# Patient Record
Sex: Female | Born: 1970 | Race: White | Hispanic: No | Marital: Married | Smoking: Never smoker
Health system: Southern US, Community
[De-identification: ages and names within clinical notes are randomized; demographics above are authoritative.]

## PROBLEM LIST (undated history)

## (undated) HISTORY — PX: BACK SURGERY: SHX140

## (undated) HISTORY — PX: NECK SURGERY: SHX720

---

## 1999-01-31 HISTORY — PX: TUBAL LIGATION: SHX77

## 2004-11-08 ENCOUNTER — Emergency Department: Payer: Self-pay | Admitting: Emergency Medicine

## 2004-11-15 ENCOUNTER — Emergency Department: Payer: Self-pay | Admitting: Emergency Medicine

## 2005-01-03 ENCOUNTER — Ambulatory Visit: Payer: Self-pay | Admitting: Obstetrics and Gynecology

## 2005-01-13 ENCOUNTER — Ambulatory Visit: Payer: Self-pay | Admitting: Obstetrics and Gynecology

## 2005-09-13 ENCOUNTER — Emergency Department: Payer: Self-pay | Admitting: Emergency Medicine

## 2008-05-29 ENCOUNTER — Emergency Department: Payer: Self-pay | Admitting: Emergency Medicine

## 2009-07-12 ENCOUNTER — Ambulatory Visit: Payer: Self-pay | Admitting: Nurse Practitioner

## 2009-08-23 ENCOUNTER — Ambulatory Visit (HOSPITAL_COMMUNITY): Admission: RE | Admit: 2009-08-23 | Discharge: 2009-08-25 | Payer: Self-pay | Admitting: Neurosurgery

## 2010-04-16 LAB — CBC
HCT: 28.1 % — ABNORMAL LOW (ref 36.0–46.0)
Hemoglobin: 9.3 g/dL — ABNORMAL LOW (ref 12.0–15.0)
MCH: 25.7 pg — ABNORMAL LOW (ref 26.0–34.0)
MCHC: 33.1 g/dL (ref 30.0–36.0)
MCV: 77.8 fL — ABNORMAL LOW (ref 78.0–100.0)
Platelets: 286 10*3/uL (ref 150–400)
RBC: 3.62 MIL/uL — ABNORMAL LOW (ref 3.87–5.11)
RDW: 20.1 % — ABNORMAL HIGH (ref 11.5–15.5)
WBC: 5.6 10*3/uL (ref 4.0–10.5)

## 2010-04-16 LAB — BASIC METABOLIC PANEL
BUN: 9 mg/dL (ref 6–23)
CO2: 21 mEq/L (ref 19–32)
Calcium: 8.6 mg/dL (ref 8.4–10.5)
Chloride: 106 mEq/L (ref 96–112)
Creatinine, Ser: 0.66 mg/dL (ref 0.4–1.2)
GFR calc Af Amer: >60 mL/min (ref >60)
GFR calc non Af Amer: >60 mL/min (ref >60)
Glucose, Bld: 93 mg/dL (ref 70–99)
Potassium: 4.2 mEq/L (ref 3.5–5.1)
Sodium: 134 mEq/L — ABNORMAL LOW (ref 135–145)

## 2010-04-16 LAB — SURGICAL PCR SCREEN
MRSA, PCR: NEGATIVE
Staphylococcus aureus: NEGATIVE

## 2010-04-16 LAB — TYPE AND SCREEN
ABO/RH(D): O POS
Antibody Screen: NEGATIVE

## 2010-04-16 LAB — ABO/RH: ABO/RH(D): O POS

## 2015-01-30 ENCOUNTER — Emergency Department: Payer: Medicaid Other

## 2015-01-30 ENCOUNTER — Emergency Department
Admission: EM | Admit: 2015-01-30 | Discharge: 2015-01-30 | Disposition: A | Discharge status: Home / Self Care | Payer: Medicaid Other | Attending: Emergency Medicine | Admitting: Emergency Medicine

## 2015-01-30 ENCOUNTER — Encounter: Payer: Self-pay | Admitting: Emergency Medicine

## 2015-01-30 DIAGNOSIS — J069 Acute upper respiratory infection, unspecified: Secondary | ICD-10-CM | POA: Diagnosis not present

## 2015-01-30 DIAGNOSIS — R05 Cough: Secondary | ICD-10-CM | POA: Diagnosis present

## 2015-01-30 MED ORDER — GUAIFENESIN-CODEINE 100-10 MG/5ML PO SOLN: 10.0000 mL | Freq: Three times a day (TID) | ORAL | Status: DC | PRN

## 2015-01-30 NOTE — ED Notes (Signed)
Discussed discharge instructions, prescriptions, and follow-up care with patient. No questions or concerns at this time. Pt stable at discharge.  

## 2015-01-30 NOTE — ED Notes (Signed)
Cough fever and chills and body aches x 2 days

## 2015-01-30 NOTE — Discharge Instructions (Signed)
Upper Respiratory Infection, Adult Most upper respiratory infections (URIs) are a viral infection of the air passages leading to the lungs. A URI affects the nose, throat, and upper air passages. The most common type of URI is nasopharyngitis and is typically referred to as "the common cold." URIs run their course and usually go away on their own. Most of the time, a URI does not require medical attention, but sometimes a bacterial infection in the upper airways can follow a viral infection. This is called a secondary infection. Sinus and middle ear infections are common types of secondary upper respiratory infections. Bacterial pneumonia can also complicate a URI. A URI can worsen asthma and chronic obstructive pulmonary disease (COPD). Sometimes, these complications can require emergency medical care and may be life threatening.  CAUSES Almost all URIs are caused by viruses. A virus is a type of germ and can spread from one person to another.  RISKS FACTORS You may be at risk for a URI if:   You smoke.   You have chronic heart or lung disease.  You have a weakened defense (immune) system.   You are very young or very old.   You have nasal allergies or asthma.  You work in crowded or poorly ventilated areas.  You work in health care facilities or schools. SIGNS AND SYMPTOMS  Symptoms typically develop 2-3 days after you come in contact with a cold virus. Most viral URIs last 7-10 days. However, viral URIs from the influenza virus (flu virus) can last 14-18 days and are typically more severe. Symptoms may include:   Runny or stuffy (congested) nose.   Sneezing.   Cough.   Sore throat.   Headache.   Fatigue.   Fever.   Loss of appetite.   Pain in your forehead, behind your eyes, and over your cheekbones (sinus pain).  Muscle aches.  DIAGNOSIS  Your health care provider may diagnose a URI by:  Physical exam.  Tests to check that your symptoms are not due to  another condition such as:  Strep throat.  Sinusitis.  Pneumonia.  Asthma. TREATMENT  A URI goes away on its own with time. It cannot be cured with medicines, but medicines may be prescribed or recommended to relieve symptoms. Medicines may help:  Reduce your fever.  Reduce your cough.  Relieve nasal congestion. HOME CARE INSTRUCTIONS   Take medicines only as directed by your health care provider.   Gargle warm saltwater or take cough drops to comfort your throat as directed by your health care provider.  Use a warm mist humidifier or inhale steam from a shower to increase air moisture. This may make it easier to breathe.  Drink enough fluid to keep your urine clear or pale yellow.   Eat soups and other clear broths and maintain good nutrition.   Rest as needed.   Return to work when your temperature has returned to normal or as your health care provider advises. You may need to stay home longer to avoid infecting others. You can also use a face mask and careful hand washing to prevent spread of the virus.  Increase the usage of your inhaler if you have asthma.   Do not use any tobacco products, including cigarettes, chewing tobacco, or electronic cigarettes. If you need help quitting, ask your health care provider. PREVENTION  The best way to protect yourself from getting a cold is to practice good hygiene.   Avoid oral or hand contact with people with cold   symptoms.   Wash your hands often if contact occurs.  There is no clear evidence that vitamin C, vitamin E, echinacea, or exercise reduces the chance of developing a cold. However, it is always recommended to get plenty of rest, exercise, and practice good nutrition.  SEEK MEDICAL CARE IF:   You are getting worse rather than better.   Your symptoms are not controlled by medicine.   You have chills.  You have worsening shortness of breath.  You have brown or red mucus.  You have yellow or brown nasal  discharge.  You have pain in your face, especially when you bend forward.  You have a fever.  You have swollen neck glands.  You have pain while swallowing.  You have white areas in the back of your throat. SEEK IMMEDIATE MEDICAL CARE IF:   You have severe or persistent:  Headache.  Ear pain.  Sinus pain.  Chest pain.  You have chronic lung disease and any of the following:  Wheezing.  Prolonged cough.  Coughing up blood.  A change in your usual mucus.  You have a stiff neck.  You have changes in your:  Vision.  Hearing.  Thinking.  Mood. MAKE SURE YOU:   Understand these instructions.  Will watch your condition.  Will get help right away if you are not doing well or get worse.   This information is not intended to replace advice given to you by your health care provider. Make sure you discuss any questions you have with your health care provider.   Document Released: 07/12/2000 Document Revised: 06/02/2014 Document Reviewed: 04/23/2013 Elsevier Interactive Patient Education 2016 [employer]  

## 2015-01-30 NOTE — ED Provider Notes (Signed)
[facility] Emergency Department Provider Note  ____________________________________________  Time seen: Approximately 3:46 PM  I have reviewed the triage vital signs and the nursing notes.   HISTORY  Chief Complaint Cough   HPI Jacqueline Patterson is a 44 y.o. female who presents to the emergency department for evaluation of nonproductive cough, fever, and chills 2 days. She is also complains of body aches and chills. No relief with motrin.  History reviewed. No pertinent past medical history.  There are no active problems to display for this patient.   History reviewed. No pertinent past surgical history.  Current Outpatient Rx  Name  Route  Sig  Dispense  Refill  . guaiFENesin-codeine 100-10 MG/5ML syrup   Oral   Take 10 mLs by mouth 3 (three) times daily as needed.   120 mL   0     Allergies Review of patient's allergies indicates no known allergies.  No family history on file.  Social History Social History  Substance Use Topics  . Smoking status: Never Smoker   . Smokeless tobacco: None  . Alcohol Use: No    Review of Systems Constitutional: Positive for fever/chills Eyes: No visual changes. ENT: Positive for sore throat. Cardiovascular: Denies chest pain. Chest is "burning" Respiratory: Negative for shortness of breath. Positive for cough. Gastrointestinal: Negative for abdominal pain. Negative for nausea,  no vomiting.  No diarrhea.  Genitourinary: Negative for dysuria. Musculoskeletal: Positive for body aches Skin: Negative for rash. Neurological: Negative for headaches, Negative for focal weakness or numbness.  10-point ROS otherwise negative.  ____________________________________________   PHYSICAL EXAM:  VITAL SIGNS: ED Triage Vitals  Enc Vitals Group     BP 01/30/15 1404 136/93 mmHg     Pulse Rate 01/30/15 1404 84     Resp 01/30/15 1404 18     Temp 01/30/15 1404 99.1 F (37.3 C)     Temp Source 01/30/15 1404  Oral     SpO2 01/30/15 1404 99 %     Weight 01/30/15 1404 200 lb (90.719 kg)     Height 01/30/15 1404 5\' 4"  (1.626 m)     Head Cir --      Peak Flow --      Pain Score 01/30/15 1408 6     Pain Loc --      Pain Edu? --      Excl. in GC? --     Constitutional: Alert and oriented. Well appearing and in no acute distress. Eyes: Conjunctivae are normal. PERRL. EOMI. Ears: Serous OM bilaterally Head: Atraumatic. Nose: No congestion/rhinnorhea. Mouth/Throat: Mucous membranes are moist.  Oropharynx erythematous with bilateral tonsillar edema, no exudate noted. Neck: No stridor.  Lymphatic: Bilateral anterior cervical lymphadenopathy. Cardiovascular: Normal rate, regular rhythm. Grossly normal heart sounds.  Good peripheral circulation. Respiratory: Normal respiratory effort.  No retractions. Lungs clear to auscultation throughout. Gastrointestinal: Soft and nontender. No distention. No abdominal bruits. No CVA tenderness. Musculoskeletal: No joint pain reported. Neurologic:  Normal speech and language. No gross focal neurologic deficits are appreciated. Speech is normal. No gait instability. Skin:  Skin is warm, dry and intact. No rash noted. Psychiatric: Mood and affect are normal. Speech and behavior are normal.  ____________________________________________   LABS (all labs ordered are listed, but only abnormal results are displayed)  Labs Reviewed - No data to display ____________________________________________  EKG   ____________________________________________  RADIOLOGY  Chest xray negative for acute abnormality per radiology. ____________________________________________   PROCEDURES  Procedure(s) performed: None  Critical Care  performed: No  ____________________________________________   INITIAL IMPRESSION / ASSESSMENT AND PLAN / ED COURSE  Pertinent labs & imaging results that were available during my care of the patient were reviewed by me and considered in  my medical decision making (see chart for details).   Patient was advised to take the Robitussin-AC as prescribed. She was advised to continue taking her ibuprofen every 6 hours. She was advised to return to the emergency department for symptoms that change or worsen if she is unable schedule an appointment with primary care. ____________________________________________   FINAL CLINICAL IMPRESSION(S) / ED DIAGNOSES  Final diagnoses:  Upper respiratory infection, acute       Chinita Pester, FNP 01/30/15 1620  Phineas Semen, MD 01/31/15 [phone]

## 2015-03-03 ENCOUNTER — Encounter (HOSPITAL_COMMUNITY): Payer: Self-pay | Admitting: Emergency Medicine

## 2015-03-03 ENCOUNTER — Emergency Department (HOSPITAL_COMMUNITY)
Admission: EM | Admit: 2015-03-03 | Discharge: 2015-03-03 | Disposition: A | Discharge status: Home / Self Care | Payer: Medicaid Other | Attending: Emergency Medicine | Admitting: Emergency Medicine

## 2015-03-03 DIAGNOSIS — K0889 Other specified disorders of teeth and supporting structures: Secondary | ICD-10-CM | POA: Diagnosis present

## 2015-03-03 DIAGNOSIS — Z972 Presence of dental prosthetic device (complete) (partial): Secondary | ICD-10-CM | POA: Diagnosis not present

## 2015-03-03 MED ORDER — HYDROCODONE-ACETAMINOPHEN 5-325 MG PO TABS: 1.0000 | ORAL_TABLET | ORAL | Status: DC | PRN

## 2015-03-03 MED ORDER — HYDROCODONE-ACETAMINOPHEN 5-325 MG PO TABS
1.0000 | ORAL_TABLET | Freq: Once | ORAL | Status: AC
  Administered 2015-03-03: 1 via ORAL
  Filled 2015-03-03: qty 1

## 2015-03-03 NOTE — ED Provider Notes (Signed)
CSN: [id number]     Arrival date & time 03/03/15  2151 History  By signing my name below, I, Jacqueline Patterson, attest that this documentation has been prepared under the direction and in the presence of [employer], PA-C. Electronically Signed: Evon Patterson, ED Scribe. 03/03/2015. 10:06 PM.     Chief Complaint  Patient presents with  . Dental Pain    The history is provided by the patient. No language interpreter was used.   HPI Comments: Jacqueline Patterson is a 45 y.o. female who presents to the Emergency Department complaining of upper left dental pain onset tonight. Pt states she has associated swelling. Pt states that tonight she was eating and chipped the upper left molar. Pt states she has tried ibuprofen with no relief. Pt denies fever, nausea, vomiting, trouble swallowing or other related symptoms.   History reviewed. No pertinent past medical history. History reviewed. No pertinent past surgical history. No family history on file. Social History  Substance Use Topics  . Smoking status: Never Smoker   . Smokeless tobacco: None  . Alcohol Use: No   OB History    No data available      Review of Systems  Constitutional: Negative for fever.  HENT: Positive for dental problem and facial swelling. Negative for trouble swallowing.   Gastrointestinal: Negative for nausea and vomiting.     Allergies  Review of patient's allergies indicates no known allergies.  Home Medications   Prior to Admission medications   Medication Sig Start Date End Date Taking? Authorizing Provider  guaiFENesin-codeine 100-10 MG/5ML syrup Take 10 mLs by mouth 3 (three) times daily as needed. 01/30/15   Cari B Triplett, FNP   BP 152/91 mmHg  Pulse 71  Temp(Src) 98.3 F (36.8 C) (Oral)  Resp 18  SpO2 99%  LMP 02/02/2015   Physical Exam  Constitutional: She is oriented to person, place, and time. She appears well-developed and well-nourished. No distress.  HENT:  Head: Normocephalic and  atraumatic.  Mouth/Throat: Oropharynx is clear and moist.  Partial upper denture plate with a rear molar as anchor with small lateral fracture, no visualized abscess. No facial swelling.  Eyes: Conjunctivae and EOM are normal.  Neck: Neck supple. No tracheal deviation present.  Cardiovascular: Normal rate.   Pulmonary/Chest: Effort normal. No respiratory distress.  Musculoskeletal: Normal range of motion.  Neurological: She is alert and oriented to person, place, and time.  Skin: Skin is warm and dry.  Psychiatric: She has a normal mood and affect. Her behavior is normal.  Nursing note and vitals reviewed.   ED Course  Procedures (including critical care time) DIAGNOSTIC STUDIES: Oxygen Saturation is 99% on RA, normal by my interpretation.    COORDINATION OF CARE: 10:17 PM-Discussed treatment plan with pt at bedside and pt agreed to plan.     Labs Review Labs Reviewed - No data to display  Imaging Review No results found.    EKG Interpretation None      MDM   Final diagnoses:  None    1. Dental fracture  Will provide resources for dental care. Discussed pain management and importance of dental follow up.  I personally performed the services described in this documentation, which was scribed in my presence. The recorded information has been reviewed and is accurate.       Elpidio Anis, PA-C 03/03/15 2222  Melene Plan, DO 03/03/15 2258

## 2015-03-03 NOTE — Discharge Instructions (Signed)
Dental Care and Dentist Visits Dental care supports good overall health. Regular dental visits can also help you avoid dental pain, bleeding, infection, and other more serious health problems in the future. It is important to keep the mouth healthy because diseases in the teeth, gums, and other oral tissues can spread to other areas of the body. Some problems, such as diabetes, heart disease, and pre-term labor have been associated with poor oral health.  See your dentist every 6 months. If you experience emergency problems such as a toothache or broken tooth, go to the dentist right away. If you see your dentist regularly, you may catch problems early. It is easier to be treated for problems in the early stages.  WHAT TO EXPECT AT A DENTIST VISIT  Your dentist will look for many common oral health problems and recommend proper treatment. At your regular dental visit, you can expect:  Gentle cleaning of the teeth and gums. This includes scraping and polishing. This helps to remove the sticky substance around the teeth and gums (plaque). Plaque forms in the mouth shortly after eating. Over time, plaque hardens on the teeth as tartar. If tartar is not removed regularly, it can cause problems. Cleaning also helps remove stains.  Periodic X-rays. These pictures of the teeth and supporting bone will help your dentist assess the health of your teeth.  Periodic fluoride treatments. Fluoride is a natural mineral shown to help strengthen teeth. Fluoride treatmentinvolves applying a fluoride gel or varnish to the teeth. It is most commonly done in children.  Examination of the mouth, tongue, jaws, teeth, and gums to look for any oral health problems, such as:  Cavities (dental caries). This is decay on the tooth caused by plaque, sugar, and acid in the mouth. It is best to catch a cavity when it is small.  Inflammation of the gums caused by plaque buildup (gingivitis).  Problems with the mouth or malformed  or misaligned teeth.  Oral cancer or other diseases of the soft tissues or jaws. KEEP YOUR TEETH AND GUMS HEALTHY For healthy teeth and gums, follow these general guidelines as well as your dentist's specific advice:  Have your teeth professionally cleaned at the dentist every 6 months.  Brush twice daily with a fluoride toothpaste.  Floss your teeth daily.  Ask your dentist if you need fluoride supplements, treatments, or fluoride toothpaste.  Eat a healthy diet. Reduce foods and drinks with added sugar.  Avoid smoking. TREATMENT FOR ORAL HEALTH PROBLEMS If you have oral health problems, treatment varies depending on the conditions present in your teeth and gums.  Your caregiver will most likely recommend good oral hygiene at each visit.  For cavities, gingivitis, or other oral health disease, your caregiver will perform a procedure to treat the problem. This is typically done at a separate appointment. Sometimes your caregiver will refer you to another dental specialist for specific tooth problems or for surgery. SEEK IMMEDIATE DENTAL CARE IF:  You have pain, bleeding, or soreness in the gum, tooth, jaw, or mouth area.  A permanent tooth becomes loose or separated from the gum socket.  You experience a blow or injury to the mouth or jaw area.   This information is not intended to replace advice given to you by your health care provider. Make sure you discuss any questions you have with your health care provider.   Document Released: 09/28/2010 Document Revised: 04/10/2011 Document Reviewed: 09/28/2010 Elsevier Interactive Patient Education 2016 [employer]  Emergency Department Resource  Guide 1) Find a Doctor and Pay Out of Pocket Although you won't have to find out who is covered by your insurance plan, it is a good idea to ask around and get recommendations. You will then need to call the office and see if the doctor you have chosen will accept you as a new patient and  what types of options they offer for patients who are self-pay. Some doctors offer discounts or will set up payment plans for their patients who do not have insurance, but you will need to ask so you aren't surprised when you get to your appointment.  2) Contact Your Local Health Department Not all health departments have doctors that can see patients for sick visits, but many do, so it is worth a call to see if yours does. If you don't know where your local health department is, you can check in your phone book. The CDC also has a tool to help you locate your state's health department, and many state websites also have listings of all of their local health departments.  3) Find a Walk-in Clinic If your illness is not likely to be very severe or complicated, you may want to try a walk in clinic. These are popping up all over the country in pharmacies, drugstores, and shopping centers. They're usually staffed by nurse practitioners or physician assistants that have been trained to treat common illnesses and complaints. They're usually fairly quick and inexpensive. However, if you have serious medical issues or chronic medical problems, these are probably not your best option.  No Primary Care Doctor: - Call Health Connect at  [phone] - they can help you locate a primary care doctor that  accepts your insurance, provides certain services, etc. - Physician Referral Service- [phone]  Chronic Pain Problems: Organization         Address  Phone   Notes  [facility]  [phone] Patients need to be referred by their primary care doctor.   Medication Assistance: Organization         Address  Phone   Notes  [facility] Medication [facility] [address] [place] [place], [place] [address] [phone] --Must be a resident of [facility] -- Must have NO insurance coverage whatsoever (no Medicaid/ Medicare, etc.) -- The pt. MUST have a primary care doctor  that directs their care regularly and follows them in the community   [facility]  [phone]   [employer]  [phone]    Agencies that provide inexpensive medical care: Organization         Address  Phone   Notes  [facility]  [phone]   [facility]    [phone]   [facility] [address] [place], [place] [address] [phone]   [facility] [address] [place] [place] [phone]   [facility]anned Parenthood    [phone]   [facility]    [phone]   [facility]  [address], [place] Phone:  [phone], Fax:  [phone] Hours of Operation:  9 am - 6 pm, M-F.  Also accepts Medicaid/Medicare and self-pay.  [facility]  [address], [place] Phone: [phone], Fax: [phone]. Hours of Operation:  8:30 am - 5:30 pm, M-F.  Also accepts Medicaid and self-pay.  [facility] [place] [address]  [place] Phone: [phone]   [facility] [address] Natasha Bence [place], [place] [phone], Ext. [phone] Mondays & Thursdays: 7-9 AM.  First 15 patients are seen on a first come, first serve basis.    Medicaid-accepting [facility] Providers:  Organization         Address  Phone   Notes  [facility] [address], [place] [phone] Also accepts self-pay patients.  [facility] [address] Laurell Josephs [place], [place]  [phone]   [facility] [address], [place] [phone]   [facility] [address], [place] [phone]   Renaye Rakers [address], [place]   [phone] Only accepts [place] Access [place] patients after they have their name applied to their card.   Self-Pay (no insurance) in [facility]:  Organization          Address  Phone   Notes  Sickle Cell Patients, [facility] [address] [place], [place] [phone]   [facility] [address] [place], [place] [phone]   [facility] [place]  [address], [place] [phone]   [facility]/Dr. Osei-Bonsu  [address], [place] or [address], [place] [phone] Phone number for both [place] and [place] locations is the same.  [facility] [address], [place] [phone]   [facility] [address] [place] or [address] [phone]   [facility] [address], [place] [phone], phone; [phone], fax Sees patients 1st and 3rd Saturday of every month.  Must not qualify for public or private insurance (i.e. Medicaid, Medicare, [place] Health Choice, Veterans' Benefits)  Household income should be no more than 200% of the poverty level The clinic cannot treat you if you are pregnant or think you are pregnant  Sexually transmitted diseases are not treated at the clinic.    Dental Care: Organization         Address  Phone  Notes  [facility] Department of [facility] [address] [place], [place] [phone] Accepts children up to age 84 who are enrolled in [place] or [place] Health Choice; pregnant women with a Medicaid card; and children who have applied for Medicaid or [place] Health Choice, but were declined, whose parents can pay a reduced fee at time of service.  [facility] Department of [facility]  [address], [place] [phone] Accepts children up to age 80 who are enrolled in [place] or [place] Health Choice; pregnant women with a Medicaid card; and children who have applied for Medicaid or [place] Health Choice, but were declined, whose parents can pay a reduced fee at time of  service.  [facility]  [address] [place], [place] [phone] Patients are seen by appointment only. Walk-ins are not accepted. [facility] will see patients 13 years of age and older. Monday - Tuesday (8am-5pm) Most Wednesdays (8:30-5pm) $30 per visit, cash only  [facility]  [address], [place] [facility] [phone] Patients are seen by appointment only. Walk-ins are not accepted. [facility] will see patients 57 years of age and older. One Wednesday Evening (  Monthly: Volunteer Based).  $30 per visit, cash only  [employer]  [phone] for adults; Children under age 7, call Graduate Pediatric Dentistry at [phone]. Children aged 43-14, please call [phone] to request a pediatric application.  Dental services are provided in all areas of dental care including fillings, crowns and bridges, complete and partial dentures, implants, gum treatment, root canals, and extractions. Preventive care is also provided. Treatment is provided to both adults and children. Patients are selected via a lottery and there is often a waiting list.   [facility] [address] [place]  [phone] [email or web address]   [facility] [address] [place], [place] [phone], Ext. [phone] Second and Fourth Thursday of each month, opens at 6:30 AM; Clinic ends at 9 AM.  Patients are seen on a first-come first-served basis, and a limited number are seen during each clinic.   [facility]  [address] Ether [place], [place] [phone]   Eligibility Requirements You must have lived in [place], [place], or [place] counties for at least the last three months.   You cannot be eligible for state or federal sponsored [place], including [employer], [place], or [employer].   You generally cannot be eligible for healthcare insurance through your employer.     How to apply: Eligibility screenings are held every Tuesday and Wednesday afternoon from 1:00 pm until 4:00 pm. You do not need an appointment for the interview!  [facility] [address], [place], [place] [phone]   [facility] Health Department  [phone]   [facility] Health Department  [phone]   [facility] Health Department  [phone]    Behavioral Health Resources in the Community: Intensive Outpatient Programs Organization         Address  Phone  Notes  [facility] [address], [place], [place] [phone]   [facility] Outpatient [address], [place], [place] [phone]   [facility] [address] [place], [place]  [phone]   [facility] Mental Health [address]  [place], [place] [phone] or [phone]   Substance Abuse Resources Organization         Address  Phone  Notes  [facility]  [phone]   [facility]  [phone]   [place]  [phone]   Floydene Flock  [phone]   Residential & Outpatient Substance Abuse Program  [phone]   Psychological Services Organization         Address  Phone  Notes  [facility] Behavioral Health  [phone]   [facility] Services  [phone]   [facility] Mental Health [address] [place] [address] [place] [phone] or [phone]    Mobile Crisis Teams Organization         Address  Phone  Notes  [facility], Mobile Crisis Care Unit  [phone]   [facility]  [address] [place], [place] [phone]   Doristine Locks [address] [place] [phone]    Self-Help/Support Groups Organization         Address  Phone             Notes  [facility] [place] - variety of support groups  336- I7437963 Call for more information  Narcotics Anonymous (NA), [facility] [address]  Dr, [employer] [place]  2 meetings at this location  Residential Treatment Programs Organization         Address  Phone  Notes  [facility] [address]    [place]  [phone]   [facility]  [address] [place] [address], [place], [place] [phone]   [facility] [address] [place], [place] [phone] Admissions: 8am-3pm M-F  [facility] [address]    [place], [place] [phone]   [facility] [address] [place], [place], [place] [phone]   [facility] [address]  [place], [place] [phone]   [facility] - Intensive Outpatient [address] Laurell Josephs [address], [place], [place] [phone]   [facility] ([facility] [address] [place]  [place], [place] [phone] or [phone]   [facility] ([facility]) [address], [place], [place] [phone] Accepts Medicaid  [facility] [address],  [place] [phone] Substance Abuse/Addiction Treatment   [facility] Organization         Address  Phone  Notes  [facility]  [phone]   Angie Fava, PhD [address] Ervin Knack [place], [place]   [phone] or [phone]   [facility]   [address] [place], [place] [phone]   [facility] [address], [place], [place] [phone] Insurance/Medicaid/sponsorship through [facility] [address]                                    [place], [place] [phone] Therapy/tele-psych/case  [facility] [address], [place] [phone]    Dr. Lolly Mustache  [phone]   [facility]  [employer] [facility] Dept. 1) [address], [place] 2) [address] [place] 3)  [address], [place] [phone]  [phone]   [facility] Child Abuse  Hotline [phone] or [phone] (After Hours)

## 2015-03-03 NOTE — ED Notes (Signed)
Patient presents for left upper chipped molar approximately 1900 this evening,  ibuprofen approxmiately 1915. Rates pain 7/10.

## 2015-03-25 ENCOUNTER — Encounter (HOSPITAL_COMMUNITY): Payer: Self-pay | Admitting: Emergency Medicine

## 2015-03-25 ENCOUNTER — Emergency Department (HOSPITAL_COMMUNITY)
Admission: EM | Admit: 2015-03-25 | Discharge: 2015-03-25 | Disposition: A | Discharge status: Home / Self Care | Payer: Medicaid Other | Attending: Emergency Medicine | Admitting: Emergency Medicine

## 2015-03-25 DIAGNOSIS — K047 Periapical abscess without sinus: Secondary | ICD-10-CM | POA: Diagnosis not present

## 2015-03-25 DIAGNOSIS — K0889 Other specified disorders of teeth and supporting structures: Secondary | ICD-10-CM | POA: Diagnosis present

## 2015-03-25 MED ORDER — HYDROCODONE-ACETAMINOPHEN 5-325 MG PO TABS: 1.0000 | ORAL_TABLET | Freq: Four times a day (QID) | ORAL | Status: DC | PRN

## 2015-03-25 MED ORDER — PENICILLIN V POTASSIUM 500 MG PO TABS: 500.0000 mg | ORAL_TABLET | Freq: Four times a day (QID) | ORAL | Status: AC

## 2015-03-25 NOTE — ED Notes (Signed)
Pt c/o left lower dental pain with moderate-significant edema to left lower jaw, difficulty closing jaw onset today.

## 2015-03-25 NOTE — Discharge Instructions (Signed)
Follow up with your dentist as planned next week

## 2015-03-25 NOTE — ED Provider Notes (Signed)
CSN: [id number]     Arrival date & time 03/25/15  0726 History   First MD Initiated Contact with Patient 03/25/15 [phone]     Chief Complaint  Patient presents with  . Abscess  . Dental Pain     (Consider location/radiation/quality/duration/timing/severity/associated sxs/prior Treatment) Patient is a 45 y.o. female presenting with abscess and tooth pain. The history is provided by the patient (She complains of toothache left lower mouth).  Abscess Abscess location: Dental abscess. Abscess quality: not draining   Red streaking: no   Progression:  Worsening Chronicity:  New Context: not diabetes   Associated symptoms: no fatigue and no headaches   Dental Pain Associated symptoms: no congestion and no headaches     History reviewed. No pertinent past medical history. History reviewed. No pertinent past surgical history. History reviewed. No pertinent family history. Social History  Substance Use Topics  . Smoking status: Never Smoker   . Smokeless tobacco: None  . Alcohol Use: No   OB History    No data available     Review of Systems  Constitutional: Negative for appetite change and fatigue.  HENT: Negative for congestion, ear discharge and sinus pressure.        Left lower mouth toothache  Eyes: Negative for discharge.  Respiratory: Negative for cough.   Cardiovascular: Negative for chest pain.  Gastrointestinal: Negative for abdominal pain and diarrhea.  Genitourinary: Negative for frequency and hematuria.  Musculoskeletal: Negative for back pain.  Skin: Negative for rash.  Neurological: Negative for seizures and headaches.  Psychiatric/Behavioral: Negative for hallucinations.      Allergies  Review of patient's allergies indicates no known allergies.  Home Medications   Prior to Admission medications   Medication Sig Start Date End Date Taking? Authorizing Provider  ibuprofen (ADVIL,MOTRIN) 800 MG tablet Take 800 mg by mouth every 8 (eight) hours as needed  for moderate pain.   Yes Historical Provider, MD  guaiFENesin-codeine 100-10 MG/5ML syrup Take 10 mLs by mouth 3 (three) times daily as needed. Patient not taking: Reported on 03/03/2015 01/30/15   Chinita Pester, FNP  HYDROcodone-acetaminophen (NORCO/VICODIN) 5-325 MG tablet Take 1 tablet by mouth every 6 (six) hours as needed for moderate pain. 03/25/15   Bethann Berkshire, MD  penicillin v potassium (VEETID) 500 MG tablet Take 1 tablet (500 mg total) by mouth 4 (four) times daily. 03/25/15 04/01/15  Bethann Berkshire, MD   BP 172/81 mmHg  Pulse 96  Temp(Src) 98.5 F (36.9 C) (Oral)  Resp 16  SpO2 100%  LMP 02/02/2015 Physical Exam  Constitutional: She is oriented to person, place, and time. She appears well-developed.  HENT:  Head: Normocephalic.  Tender left lower molar  Eyes: Conjunctivae and EOM are normal. No scleral icterus.  Neck: Neck supple. No thyromegaly present.  Cardiovascular: Normal rate and regular rhythm.  Exam reveals no gallop and no friction rub.   No murmur heard. Pulmonary/Chest: No stridor. She has no wheezes. She has no rales. She exhibits no tenderness.  Abdominal: She exhibits no distension. There is no tenderness. There is no rebound.  Musculoskeletal: Normal range of motion. She exhibits no edema.  Lymphadenopathy:    She has no cervical adenopathy.  Neurological: She is oriented to person, place, and time. She exhibits normal muscle tone. Coordination normal.  Skin: No rash noted. No erythema.  Psychiatric: She has a normal mood and affect. Her behavior is normal.    ED Course  Procedures (including critical care time) Labs Review Labs Reviewed -  No data to display  Imaging Review No results found. I have personally reviewed and evaluated these images and lab results as part of my medical decision-making.   EKG Interpretation None      MDM   Final diagnoses:  Tooth abscess    Patient with abscessed tooth. She is given penicillin and Vicodin and  has an appointment with her dentist on Thursday    Bethann Berkshire, MD 03/25/15 [phone]

## 2015-06-09 ENCOUNTER — Other Ambulatory Visit: Payer: Self-pay | Admitting: Internal Medicine

## 2015-06-09 DIAGNOSIS — Z1231 Encounter for screening mammogram for malignant neoplasm of breast: Secondary | ICD-10-CM

## 2015-10-20 ENCOUNTER — Ambulatory Visit: Payer: Medicaid Other | Attending: Internal Medicine

## 2016-01-01 ENCOUNTER — Emergency Department (HOSPITAL_COMMUNITY)
Admission: EM | Admit: 2016-01-01 | Discharge: 2016-01-01 | Disposition: A | Discharge status: Home / Self Care | Payer: Medicaid Other | Attending: Emergency Medicine | Admitting: Emergency Medicine

## 2016-01-01 ENCOUNTER — Encounter (HOSPITAL_COMMUNITY): Payer: Self-pay | Admitting: Emergency Medicine

## 2016-01-01 DIAGNOSIS — K0889 Other specified disorders of teeth and supporting structures: Secondary | ICD-10-CM | POA: Insufficient documentation

## 2016-01-01 DIAGNOSIS — Z79899 Other long term (current) drug therapy: Secondary | ICD-10-CM | POA: Diagnosis not present

## 2016-01-01 MED ORDER — PENICILLIN V POTASSIUM 500 MG PO TABS: 500.0000 mg | ORAL_TABLET | Freq: Four times a day (QID) | ORAL | 0 refills | Status: AC

## 2016-01-01 MED ORDER — HYDROCODONE-ACETAMINOPHEN 5-325 MG PO TABS: 1.0000 | ORAL_TABLET | Freq: Four times a day (QID) | ORAL | 0 refills | Status: DC | PRN

## 2016-01-01 NOTE — Discharge Instructions (Signed)
Follow-up with your dentist next week

## 2016-01-01 NOTE — ED Triage Notes (Signed)
Patient c/o left upper dental pain that radiates into left jaw. Per patient started last night with swelling which has increased. Denies any fevers. Per patient took Motrin last night at 10pm but woke with pain worse this morning.

## 2016-01-01 NOTE — ED Provider Notes (Signed)
[facility] Provider Note   CSN: [id number] Arrival date & time: 01/01/16  0710     History   Chief Complaint Chief Complaint  Patient presents with  . Dental Pain    HPI Jacqueline Patterson is a 45 y.o. female.  Patient recently had dental work this week. She started yesterday with swelling to left upper face and tenderness. This is either that she had the tooth worked on.   The history is provided by the patient. No language interpreter was used.  Dental Pain   This is a new problem. The current episode started 6 to 12 hours ago. The problem occurs constantly. The problem has not changed since onset.The pain is at a severity of 7/10. The pain is moderate. Treatments tried: Motrin. The treatment provided mild relief.    History reviewed. No pertinent past medical history.  There are no active problems to display for this patient.   Past Surgical History:  Procedure Laterality Date  . BACK SURGERY    . CESAREAN SECTION     x2    OB History    Gravida Para Term Preterm AB Living   2 2 2     2    SAB TAB Ectopic Multiple Live Births                   Home Medications    Prior to Admission medications   Medication Sig Start Date End Date Taking? Authorizing Provider  guaiFENesin-codeine 100-10 MG/5ML syrup Take 10 mLs by mouth 3 (three) times daily as needed. Patient not taking: Reported on 03/03/2015 01/30/15   Chinita Pesterari B Triplett, FNP  HYDROcodone-acetaminophen (NORCO/VICODIN) 5-325 MG tablet Take 1 tablet by mouth every 6 (six) hours as needed. 01/01/16   Bethann BerkshireJoseph Kiely Cousar, MD  ibuprofen (ADVIL,MOTRIN) 800 MG tablet Take 800 mg by mouth every 8 (eight) hours as needed for moderate pain.    Historical Provider, MD  penicillin v potassium (VEETID) 500 MG tablet Take 1 tablet (500 mg total) by mouth 4 (four) times daily. 01/01/16 01/08/16  Bethann BerkshireJoseph Tanja Gift, MD    Family History Family History  Problem Relation Age of Onset  . Cancer Mother   . Stroke Other   .  Seizures Other     Social History Social History  Substance Use Topics  . Smoking status: Never Smoker  . Smokeless tobacco: Never Used  . Alcohol use No     Allergies   Patient has no known allergies.   Review of Systems Review of Systems  Constitutional: Negative for appetite change and fatigue.  HENT: Negative for congestion, ear discharge and sinus pressure.        Left upper toothache  Eyes: Negative for discharge.  Respiratory: Negative for cough.   Cardiovascular: Negative for chest pain.  Gastrointestinal: Negative for abdominal pain and diarrhea.  Genitourinary: Negative for frequency and hematuria.  Musculoskeletal: Negative for back pain.  Skin: Negative for rash.  Neurological: Negative for seizures and headaches.  Psychiatric/Behavioral: Negative for hallucinations.     Physical Exam Updated Vital Signs BP 158/98 (BP Location: Right Arm)   Pulse 64   Temp 97.9 F (36.6 C) (Oral)   Resp 18   Ht 5\' 4"  (1.626 m)   Wt 175 lb (79.4 kg)   LMP 12/27/2015   SpO2 99%   BMI 30.04 kg/m   Physical Exam  Constitutional: She is oriented to person, place, and time. She appears well-developed.  HENT:  Head: Normocephalic.  Tender left upper  molar with swelling to left cheek  Eyes: Conjunctivae are normal.  Neck: No tracheal deviation present.  Cardiovascular:  No murmur heard. Musculoskeletal: Normal range of motion.  Neurological: She is oriented to person, place, and time.  Skin: Skin is warm.  Psychiatric: She has a normal mood and affect.     ED Treatments / Results  Labs (all labs ordered are listed, but only abnormal results are displayed) Labs Reviewed - No data to display  EKG  EKG Interpretation None       Radiology No results found.  Procedures Procedures (including critical care time)  Medications Ordered in ED Medications - No data to display   Initial Impression / Assessment and Plan / ED Course  I have reviewed the  triage vital signs and the nursing notes.  Pertinent labs & imaging results that were available during my care of the patient were reviewed by me and considered in my medical decision making (see chart for details).  Clinical Course     Patient with infected dental work. She will be put on penicillin and given some Vicodin and is to follow-up with her dentist next week  Final Clinical Impressions(s) / ED Diagnoses   Final diagnoses:  Pain, dental    New Prescriptions New Prescriptions   HYDROCODONE-ACETAMINOPHEN (NORCO/VICODIN) 5-325 MG TABLET    Take 1 tablet by mouth every 6 (six) hours as needed.   PENICILLIN V POTASSIUM (VEETID) 500 MG TABLET    Take 1 tablet (500 mg total) by mouth 4 (four) times daily.     Bethann BerkshireJoseph Azhane Eckart, MD 01/01/16 [phone]

## 2016-06-22 ENCOUNTER — Other Ambulatory Visit (HOSPITAL_COMMUNITY): Payer: Self-pay | Admitting: Internal Medicine

## 2016-06-22 DIAGNOSIS — Z1231 Encounter for screening mammogram for malignant neoplasm of breast: Secondary | ICD-10-CM

## 2016-06-29 ENCOUNTER — Ambulatory Visit (HOSPITAL_COMMUNITY): Payer: Medicaid Other

## 2016-06-29 ENCOUNTER — Encounter (HOSPITAL_COMMUNITY): Payer: Self-pay

## 2016-07-30 ENCOUNTER — Encounter (HOSPITAL_COMMUNITY): Payer: Self-pay | Admitting: Emergency Medicine

## 2016-07-30 ENCOUNTER — Emergency Department (HOSPITAL_COMMUNITY): Payer: Medicaid Other

## 2016-07-30 ENCOUNTER — Emergency Department (HOSPITAL_COMMUNITY)
Admission: EM | Admit: 2016-07-30 | Discharge: 2016-07-30 | Disposition: A | Discharge status: Home / Self Care | Payer: Medicaid Other | Attending: Emergency Medicine | Admitting: Emergency Medicine

## 2016-07-30 DIAGNOSIS — R0789 Other chest pain: Secondary | ICD-10-CM | POA: Diagnosis not present

## 2016-07-30 DIAGNOSIS — R079 Chest pain, unspecified: Secondary | ICD-10-CM | POA: Diagnosis present

## 2016-07-30 DIAGNOSIS — R0781 Pleurodynia: Secondary | ICD-10-CM

## 2016-07-30 MED ORDER — HYDROCODONE-ACETAMINOPHEN 5-325 MG PO TABS
1.0000 | ORAL_TABLET | Freq: Once | ORAL | Status: AC
  Administered 2016-07-30: 1 via ORAL
  Filled 2016-07-30: qty 1

## 2016-07-30 MED ORDER — IBUPROFEN 800 MG PO TABS: 800.0000 mg | ORAL_TABLET | Freq: Three times a day (TID) | ORAL | 1 refills | Status: AC

## 2016-07-30 MED ORDER — KETOROLAC TROMETHAMINE 60 MG/2ML IM SOLN
60.0000 mg | Freq: Once | INTRAMUSCULAR | Status: AC
  Administered 2016-07-30: 60 mg via INTRAMUSCULAR
  Filled 2016-07-30: qty 2

## 2016-07-30 MED ORDER — HYDROCODONE-ACETAMINOPHEN 5-325 MG PO TABS: 1.0000 | ORAL_TABLET | Freq: Four times a day (QID) | ORAL | 0 refills | Status: DC | PRN
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
## 2017-09-04 ENCOUNTER — Other Ambulatory Visit (HOSPITAL_COMMUNITY): Payer: Self-pay | Admitting: Internal Medicine

## 2017-09-04 DIAGNOSIS — R1012 Left upper quadrant pain: Secondary | ICD-10-CM

## 2017-09-04 DIAGNOSIS — R1011 Right upper quadrant pain: Secondary | ICD-10-CM

## 2017-09-06 ENCOUNTER — Ambulatory Visit (HOSPITAL_COMMUNITY)
Admission: RE | Admit: 2017-09-06 | Discharge: 2017-09-06 | Disposition: A | Discharge status: Home / Self Care | Payer: Self-pay | Source: Ambulatory Visit | Attending: Internal Medicine | Admitting: Internal Medicine

## 2017-09-06 DIAGNOSIS — R1011 Right upper quadrant pain: Secondary | ICD-10-CM | POA: Insufficient documentation

## 2017-09-06 DIAGNOSIS — R1012 Left upper quadrant pain: Secondary | ICD-10-CM | POA: Insufficient documentation

## 2017-09-06 DIAGNOSIS — K8020 Calculus of gallbladder without cholecystitis without obstruction: Secondary | ICD-10-CM | POA: Insufficient documentation

## 2017-09-07 ENCOUNTER — Emergency Department (HOSPITAL_COMMUNITY)
Admission: EM | Admit: 2017-09-07 | Discharge: 2017-09-07 | Disposition: A | Discharge status: Home / Self Care | Payer: Medicaid Other | Attending: Emergency Medicine | Admitting: Emergency Medicine

## 2017-09-07 ENCOUNTER — Other Ambulatory Visit: Payer: Self-pay

## 2017-09-07 ENCOUNTER — Encounter (HOSPITAL_COMMUNITY): Payer: Self-pay | Admitting: Emergency Medicine

## 2017-09-07 DIAGNOSIS — K8020 Calculus of gallbladder without cholecystitis without obstruction: Secondary | ICD-10-CM | POA: Insufficient documentation

## 2017-09-07 LAB — CBC
HEMATOCRIT: 33.2 % — AB (ref 36.0–46.0)
HEMOGLOBIN: 10.4 g/dL — AB (ref 12.0–15.0)
MCH: 26.3 pg (ref 26.0–34.0)
MCHC: 31.3 g/dL (ref 30.0–36.0)
MCV: 83.8 fL (ref 78.0–100.0)
Platelets: 381 10*3/uL (ref 150–400)
RBC: 3.96 MIL/uL (ref 3.87–5.11)
RDW: 17.8 % — ABNORMAL HIGH (ref 11.5–15.5)
WBC: 7.4 10*3/uL (ref 4.0–10.5)

## 2017-09-07 LAB — COMPREHENSIVE METABOLIC PANEL
ALBUMIN: 4 g/dL (ref 3.5–5.0)
ALT: 15 U/L (ref 0–44)
ANION GAP: 8 (ref 5–15)
AST: 16 U/L (ref 15–41)
Alkaline Phosphatase: 106 U/L (ref 38–126)
BUN: 10 mg/dL (ref 6–20)
CHLORIDE: 101 mmol/L (ref 98–111)
CO2: 26 mmol/L (ref 22–32)
Calcium: 9.2 mg/dL (ref 8.9–10.3)
Creatinine, Ser: 0.81 mg/dL (ref 0.44–1.00)
GFR calc Af Amer: >60 mL/min (ref >60)
GFR calc non Af Amer: >60 mL/min (ref >60)
Glucose, Bld: 106 mg/dL — ABNORMAL HIGH (ref 70–99)
POTASSIUM: 3.5 mmol/L (ref 3.5–5.1)
Sodium: 135 mmol/L (ref 135–145)
TOTAL PROTEIN: 8.3 g/dL — AB (ref 6.5–8.1)
Total Bilirubin: 0.6 mg/dL (ref 0.3–1.2)

## 2017-09-07 LAB — URINALYSIS, ROUTINE W REFLEX MICROSCOPIC
Bilirubin Urine: NEGATIVE
GLUCOSE, UA: NEGATIVE mg/dL
Hgb urine dipstick: NEGATIVE
Ketones, ur: NEGATIVE mg/dL
LEUKOCYTES UA: NEGATIVE
NITRITE: NEGATIVE
PH: 5 (ref 5.0–8.0)
Protein, ur: NEGATIVE mg/dL
SPECIFIC GRAVITY, URINE: 1.027 (ref 1.005–1.030)

## 2017-09-07 LAB — LIPASE, BLOOD: LIPASE: 30 U/L (ref 11–51)

## 2017-09-07 LAB — PREGNANCY, URINE: Preg Test, Ur: NEGATIVE

## 2017-09-07 MED ORDER — ONDANSETRON HCL 4 MG/2ML IJ SOLN
4.0000 mg | Freq: Once | INTRAMUSCULAR | Status: AC
  Administered 2017-09-07: 4 mg via INTRAVENOUS
  Filled 2017-09-07: qty 2

## 2017-09-07 MED ORDER — ONDANSETRON 4 MG PO TBDP: 4.0000 mg | ORAL_TABLET | Freq: Three times a day (TID) | ORAL | 0 refills | Status: DC | PRN

## 2017-09-07 MED ORDER — MORPHINE SULFATE (PF) 4 MG/ML IV SOLN
4.0000 mg | Freq: Once | INTRAVENOUS | Status: AC
  Administered 2017-09-07: 4 mg via INTRAVENOUS
  Filled 2017-09-07: qty 1

## 2017-09-07 MED ORDER — HYDROCODONE-ACETAMINOPHEN 5-325 MG PO TABS: 1.0000 | ORAL_TABLET | ORAL | 0 refills | Status: DC | PRN

## 2017-09-07 MED ORDER — SODIUM CHLORIDE 0.9 % IV BOLUS
1000.0000 mL | Freq: Once | INTRAVENOUS | Status: AC
  Administered 2017-09-07: 1000 mL via INTRAVENOUS

## 2017-09-07 NOTE — ED Provider Notes (Signed)
[facility] EMERGENCY DEPARTMENT Provider Note   CSN: [id number] Arrival date & time: 09/07/17  1517     History   Chief Complaint Chief Complaint  Patient presents with  . Abdominal Pain    HPI Jacqueline Patterson is a 47 y.o. female.  Pt presents to the ED today with RUQ pain and n/v that has been going on for months.  She went to her pcp who ordered an US which was done yesterday.  She called her doctor today b/c of increased pain and was told she had gallstones and to come to the ED.  The pt denies f/c.     History reviewed. No pertinent past medical history.  There are no active problems to display for this patient.   Past Surgical History:  Procedure Laterality Date  . BACK SURGERY    . CESAREAN SECTION     x2  . NECK SURGERY       OB History    Gravida  2   Para  2   Term  2   Preterm      AB      Living  2     SAB      TAB      Ectopic      Multiple      Live Births               Home Medications    Prior to Admission medications   Medication Sig Start Date End Date Taking? Authorizing Provider  HYDROcodone-acetaminophen (NORCO/VICODIN) 5-325 MG tablet Take 1 tablet by mouth every 4 (four) hours as needed. 09/07/17   Jacalyn LefevreHaviland, Justn Quale, MD  ondansetron (ZOFRAN ODT) 4 MG disintegrating tablet Take 1 tablet (4 mg total) by mouth every 8 (eight) hours as needed. 09/07/17   Jacalyn LefevreHaviland, Alijah Hyde, MD    Family History Family History  Problem Relation Age of Onset  . Cancer Mother   . Stroke Other   . Seizures Other     Social History Social History   Tobacco Use  . Smoking status: Never Smoker  . Smokeless tobacco: Never Used  Substance Use Topics  . Alcohol use: No  . Drug use: No     Allergies   Patient has no known allergies.   Review of Systems Review of Systems  Gastrointestinal: Positive for abdominal pain, nausea and vomiting.  All other systems reviewed and are negative.    Physical Exam Updated Vital Signs BP  124/81 (BP Location: Right Arm)   Pulse 63   Temp 98.5 F (36.9 C) (Oral)   Resp 16   Ht 5\' 4"  (1.626 m)   Wt 88.5 kg   LMP 08/24/2017   SpO2 99%   BMI 33.47 kg/m   Physical Exam  Constitutional: She is oriented to person, place, and time. She appears well-developed and well-nourished.  HENT:  Head: Normocephalic and atraumatic.  Mouth/Throat: Oropharynx is clear and moist.  Eyes: Pupils are equal, round, and reactive to light. EOM are normal.  Cardiovascular: Normal rate, regular rhythm, normal heart sounds and intact distal pulses.  Pulmonary/Chest: Effort normal and breath sounds normal.  Abdominal: Normal appearance. There is tenderness in the right upper quadrant and epigastric area.  Neurological: She is alert and oriented to person, place, and time.  Skin: Skin is warm and dry. Capillary refill takes less than 2 seconds.  Psychiatric: She has a normal mood and affect. Her behavior is normal.  Nursing note and vitals reviewed.  ED Treatments / Results  Labs (all labs ordered are listed, but only abnormal results are displayed) Labs Reviewed  COMPREHENSIVE METABOLIC PANEL - Abnormal; Notable for the following components:      Result Value   Glucose, Bld 106 (*)    Total Protein 8.3 (*)    All other components within normal limits  CBC - Abnormal; Notable for the following components:   Hemoglobin 10.4 (*)    HCT 33.2 (*)    RDW 17.8 (*)    All other components within normal limits  URINALYSIS, ROUTINE W REFLEX MICROSCOPIC - Abnormal; Notable for the following components:   APPearance CLOUDY (*)    All other components within normal limits  LIPASE, BLOOD  PREGNANCY, URINE    EKG None  Radiology US Abdomen Complete  Result Date: 09/06/2017 CLINICAL DATA:  47 year old female with abdominal pain and nausea for 6 weeks. EXAM: ABDOMEN ULTRASOUND COMPLETE COMPARISON:  None. FINDINGS: Gallbladder: Multiple gallstones are noted, the largest measuring 1.4 cm.  Gallbladder wall is minimally thickened measuring 3 mm. No sonographic Murphy sign or pericholecystic fluid. Common bile duct: Diameter: 3 mm. No intrahepatic or extrahepatic biliary dilatation. Liver: No focal lesion identified. Slightly increased hepatic echogenicity noted. Portal vein is patent on color Doppler imaging with normal direction of blood flow towards the liver. IVC: No abnormality visualized. Pancreas: Visualized portion unremarkable. Spleen: Size and appearance within normal limits. Right Kidney: Length: 11 cm. Echogenicity within normal limits. No mass or hydronephrosis visualized. Left Kidney: Length: 13.6 cm. Echogenicity within normal limits. No mass or hydronephrosis visualized. Abdominal aorta: No aneurysm visualized. Other findings: None. IMPRESSION: 1. Cholelithiasis with minimal gallbladder thickening, equivocal for acute cholecystitis. If there is strong clinical suspicion for acute cholecystitis, consider nuclear medicine study. No biliary dilatation. 2. Question mild hepatic steatosis. Electronically Signed   By: Harmon Pier M.D.   On: 09/06/2017 12:56    Procedures Procedures (including critical care time)  Medications Ordered in ED Medications  sodium chloride 0.9 % bolus 1,000 mL (1,000 mLs Intravenous New Bag/Given 09/07/17 1723)  ondansetron (ZOFRAN) injection 4 mg (4 mg Intravenous Given 09/07/17 1723)  morphine 4 MG/ML injection 4 mg (4 mg Intravenous Given 09/07/17 1723)     Initial Impression / Assessment and Plan / ED Course  I have reviewed the triage vital signs and the nursing notes.  Pertinent labs & imaging results that were available during my care of the patient were reviewed by me and considered in my medical decision making (see chart for details).  GB US from yesterday reviewed by me.   Pain and nausea are resolved.  Pt feels much better.  She is instructed to f/u with Dr. Henreitta Leber (surgery) and to return if worse.  Final Clinical Impressions(s) / ED  Diagnoses   Final diagnoses:  Calculus of gallbladder without cholecystitis without obstruction    ED Discharge Orders         Ordered    HYDROcodone-acetaminophen (NORCO/VICODIN) 5-325 MG tablet  Every 4 hours PRN     09/07/17 1811    ondansetron (ZOFRAN ODT) 4 MG disintegrating tablet  Every 8 hours PRN     09/07/17 1811           Jacalyn Lefevre, MD 09/07/17 1812

## 2017-09-07 NOTE — ED Triage Notes (Signed)
Patient complaining of upper right quadrant abdominal pain and nausea "for months." States she had ultrasound yesterday that showed gallstones and nurse called and stated her doctor was out of town until Monday and if she needed anything to go to ER. States pain and nausea is worse.

## 2017-10-02 ENCOUNTER — Encounter: Payer: Self-pay | Admitting: General Surgery

## 2017-10-02 ENCOUNTER — Ambulatory Visit: Payer: Self-pay | Admitting: General Surgery

## 2017-10-02 VITALS — BP 150/84 | HR 89 | Temp 98.9°F | Resp 20 | Wt 222.0 lb

## 2017-10-02 DIAGNOSIS — K8020 Calculus of gallbladder without cholecystitis without obstruction: Secondary | ICD-10-CM

## 2017-10-02 NOTE — H&P (Signed)
Jacqueline Patterson; [id number]; March 07, 1970   HPI Patient is a 47 year old white female who was referred to my care by Alvina Filbert for evaluation treatment of right upper quadrant abdominal pain.  Patient has had this intermittently for several months.  Patient states that it occurs primarily in the evening and she has right abdominal pain which radiates around the right flank to the back.  She does have epigastric pain, nausea, vomiting.  No fever, chills, jaundice.  She currently has 0 out of 10 abdominal pain.  An ultrasound of the gallbladder revealed cholelithiasis with a normal common bile duct. History reviewed. No pertinent past medical history.  Past Surgical History:  Procedure Laterality Date  . BACK SURGERY    . CESAREAN SECTION     x2  . NECK SURGERY      Family History  Problem Relation Age of Onset  . Cancer Mother   . Stroke Other   . Seizures Other     Current Outpatient Medications on File Prior to Visit  Medication Sig Dispense Refill  . HYDROcodone-acetaminophen (NORCO/VICODIN) 5-325 MG tablet Take 1 tablet by mouth every 4 (four) hours as needed. (Patient not taking: Reported on 10/02/2017) 10 tablet 0  . ondansetron (ZOFRAN ODT) 4 MG disintegrating tablet Take 1 tablet (4 mg total) by mouth every 8 (eight) hours as needed. 10 tablet 0   No current facility-administered medications on file prior to visit.     No Known Allergies  Social History   Substance and Sexual Activity  Alcohol Use No    Social History   Tobacco Use  Smoking Status Never Smoker  Smokeless Tobacco Never Used    Review of Systems  Constitutional: Negative.   HENT: Negative.   Eyes: Negative.   Respiratory: Negative.   Cardiovascular: Negative.   Gastrointestinal: Positive for abdominal pain, heartburn, nausea and vomiting.  Genitourinary: Negative.   Musculoskeletal: Negative.   Skin: Negative.   Neurological: Negative.   Endo/Heme/Allergies: Negative.    Psychiatric/Behavioral: Negative.     Objective   Vitals:   10/02/17 0914  BP: (!) 150/84  Pulse: 89  Resp: 20  Temp: 98.9 F (37.2 C)    Physical Exam  Constitutional: She is oriented to person, place, and time. She appears well-developed and well-nourished. She does not appear ill.  HENT:  Head: Normocephalic and atraumatic.  Eyes: No scleral icterus.  Abdominal: Soft. Normal appearance and bowel sounds are normal. She exhibits no distension and no mass. There is no tenderness. No hernia.  Neurological: She is alert and oriented to person, place, and time.  Skin: Skin is warm and dry.  Vitals reviewed. Denise Hunter's notes reviewed.  Assessment  Biliary colic, cholelithiasis Plan   Patient is scheduled for laparoscopic cholecystectomy on 10/05/2017.  The risks and benefits of the procedure including bleeding, infection, hepatobiliary injury, and the possibility of an open procedure were fully explained to the patient, who gave informed consent.

## 2017-10-02 NOTE — Progress Notes (Signed)
Jacqueline Patterson; [id number]; 10/02/1970   HPI Patient is a 47-year-old white female who was referred to my care by Denise Hunter for evaluation treatment of right upper quadrant abdominal pain.  Patient has had this intermittently for several months.  Patient states that it occurs primarily in the evening and she has right abdominal pain which radiates around the right flank to the back.  She does have epigastric pain, nausea, vomiting.  No fever, chills, jaundice.  She currently has 0 out of 10 abdominal pain.  An ultrasound of the gallbladder revealed cholelithiasis with a normal common bile duct. History reviewed. No pertinent past medical history.  Past Surgical History:  Procedure Laterality Date  . BACK SURGERY    . CESAREAN SECTION     x2  . NECK SURGERY      Family History  Problem Relation Age of Onset  . Cancer Mother   . Stroke Other   . Seizures Other     Current Outpatient Medications on File Prior to Visit  Medication Sig Dispense Refill  . HYDROcodone-acetaminophen (NORCO/VICODIN) 5-325 MG tablet Take 1 tablet by mouth every 4 (four) hours as needed. (Patient not taking: Reported on 10/02/2017) 10 tablet 0  . ondansetron (ZOFRAN ODT) 4 MG disintegrating tablet Take 1 tablet (4 mg total) by mouth every 8 (eight) hours as needed. 10 tablet 0   No current facility-administered medications on file prior to visit.     No Known Allergies  Social History   Substance and Sexual Activity  Alcohol Use No    Social History   Tobacco Use  Smoking Status Never Smoker  Smokeless Tobacco Never Used    Review of Systems  Constitutional: Negative.   HENT: Negative.   Eyes: Negative.   Respiratory: Negative.   Cardiovascular: Negative.   Gastrointestinal: Positive for abdominal pain, heartburn, nausea and vomiting.  Genitourinary: Negative.   Musculoskeletal: Negative.   Skin: Negative.   Neurological: Negative.   Endo/Heme/Allergies: Negative.    Psychiatric/Behavioral: Negative.     Objective   Vitals:   10/02/17 0914  BP: (!) 150/84  Pulse: 89  Resp: 20  Temp: 98.9 F (37.2 C)    Physical Exam  Constitutional: She is oriented to person, place, and time. She appears well-developed and well-nourished. She does not appear ill.  HENT:  Head: Normocephalic and atraumatic.  Eyes: No scleral icterus.  Abdominal: Soft. Normal appearance and bowel sounds are normal. She exhibits no distension and no mass. There is no tenderness. No hernia.  Neurological: She is alert and oriented to person, place, and time.  Skin: Skin is warm and dry.  Vitals reviewed. Denise Hunter's notes reviewed.  Assessment  Biliary colic, cholelithiasis Plan   Patient is scheduled for laparoscopic cholecystectomy on 10/05/2017.  The risks and benefits of the procedure including bleeding, infection, hepatobiliary injury, and the possibility of an open procedure were fully explained to the patient, who gave informed consent. 

## 2017-10-02 NOTE — Patient Instructions (Signed)

## 2017-10-04 ENCOUNTER — Encounter (HOSPITAL_COMMUNITY): Payer: Self-pay

## 2017-10-04 ENCOUNTER — Encounter (HOSPITAL_COMMUNITY)
Admission: RE | Admit: 2017-10-04 | Discharge: 2017-10-04 | Disposition: A | Discharge status: Home / Self Care | Payer: Self-pay | Source: Ambulatory Visit | Attending: General Surgery | Admitting: General Surgery

## 2017-10-05 ENCOUNTER — Ambulatory Visit (HOSPITAL_COMMUNITY): Payer: Self-pay | Admitting: Anesthesiology

## 2017-10-05 ENCOUNTER — Encounter (HOSPITAL_COMMUNITY)
Admission: RE | Disposition: A | Discharge status: Home / Self Care | Payer: Self-pay | Source: Ambulatory Visit | Attending: General Surgery

## 2017-10-05 ENCOUNTER — Ambulatory Visit (HOSPITAL_COMMUNITY)
Admission: RE | Admit: 2017-10-05 | Discharge: 2017-10-05 | Disposition: A | Discharge status: Home / Self Care | Payer: Self-pay | Source: Ambulatory Visit | Attending: General Surgery | Admitting: General Surgery

## 2017-10-05 ENCOUNTER — Encounter (HOSPITAL_COMMUNITY): Payer: Self-pay | Admitting: *Deleted

## 2017-10-05 DIAGNOSIS — K8064 Calculus of gallbladder and bile duct with chronic cholecystitis without obstruction: Secondary | ICD-10-CM | POA: Insufficient documentation

## 2017-10-05 DIAGNOSIS — K8020 Calculus of gallbladder without cholecystitis without obstruction: Secondary | ICD-10-CM

## 2017-10-05 HISTORY — PX: CHOLECYSTECTOMY: SHX55

## 2017-10-05 LAB — PREGNANCY, URINE: PREG TEST UR: NEGATIVE

## 2017-10-05 SURGERY — LAPAROSCOPIC CHOLECYSTECTOMY
Anesthesia: General | Site: Abdomen

## 2017-10-05 MED ORDER — KETOROLAC TROMETHAMINE 30 MG/ML IJ SOLN
INTRAMUSCULAR | Status: DC | PRN
  Administered 2017-10-05: 30 mg via INTRAVENOUS

## 2017-10-05 MED ORDER — OXYCODONE-ACETAMINOPHEN 7.5-325 MG PO TABS: 1.0000 | ORAL_TABLET | Freq: Four times a day (QID) | ORAL | 0 refills | Status: DC | PRN

## 2017-10-05 MED ORDER — KETOROLAC TROMETHAMINE 30 MG/ML IJ SOLN
INTRAMUSCULAR | Status: AC
  Filled 2017-10-05: qty 1

## 2017-10-05 MED ORDER — KETOROLAC TROMETHAMINE 30 MG/ML IJ SOLN: 30.0000 mg | Freq: Once | INTRAMUSCULAR | Status: DC

## 2017-10-05 MED ORDER — DEXAMETHASONE SODIUM PHOSPHATE 4 MG/ML IJ SOLN
INTRAMUSCULAR | Status: AC
  Filled 2017-10-05: qty 1

## 2017-10-05 MED ORDER — MIDAZOLAM HCL 2 MG/2ML IJ SOLN
INTRAMUSCULAR | Status: AC
  Filled 2017-10-05: qty 2

## 2017-10-05 MED ORDER — DEXAMETHASONE SODIUM PHOSPHATE 10 MG/ML IJ SOLN
INTRAMUSCULAR | Status: DC | PRN
  Administered 2017-10-05: 4 mg via INTRAVENOUS

## 2017-10-05 MED ORDER — MIDAZOLAM HCL 5 MG/5ML IJ SOLN
INTRAMUSCULAR | Status: DC | PRN
  Administered 2017-10-05: 2 mg via INTRAVENOUS

## 2017-10-05 MED ORDER — LACTATED RINGERS IV SOLN: INTRAVENOUS | Status: DC

## 2017-10-05 MED ORDER — SUGAMMADEX SODIUM 200 MG/2ML IV SOLN
INTRAVENOUS | Status: DC | PRN
  Administered 2017-10-05: 200 mg via INTRAVENOUS

## 2017-10-05 MED ORDER — BUPIVACAINE LIPOSOME 1.3 % IJ SUSP
INTRAMUSCULAR | Status: AC
  Filled 2017-10-05: qty 20

## 2017-10-05 MED ORDER — SODIUM CHLORIDE 0.9 % IR SOLN
Status: DC | PRN
  Administered 2017-10-05: 1000 mL

## 2017-10-05 MED ORDER — ONDANSETRON HCL 4 MG/2ML IJ SOLN
INTRAMUSCULAR | Status: DC | PRN
  Administered 2017-10-05: 4 mg via INTRAVENOUS

## 2017-10-05 MED ORDER — POVIDONE-IODINE 10 % OINT PACKET
TOPICAL_OINTMENT | CUTANEOUS | Status: DC | PRN
  Administered 2017-10-05: 1 via TOPICAL

## 2017-10-05 MED ORDER — SUGAMMADEX SODIUM 200 MG/2ML IV SOLN
INTRAVENOUS | Status: AC
  Filled 2017-10-05: qty 2

## 2017-10-05 MED ORDER — FENTANYL CITRATE (PF) 250 MCG/5ML IJ SOLN
INTRAMUSCULAR | Status: AC
  Filled 2017-10-05: qty 5

## 2017-10-05 MED ORDER — CHLORHEXIDINE GLUCONATE CLOTH 2 % EX PADS: 6.0000 | MEDICATED_PAD | Freq: Once | CUTANEOUS | Status: DC

## 2017-10-05 MED ORDER — HYDROMORPHONE HCL 1 MG/ML IJ SOLN
0.2500 mg | INTRAMUSCULAR | Status: DC | PRN
  Administered 2017-10-05: 0.5 mg via INTRAVENOUS
  Filled 2017-10-05: qty 0.5

## 2017-10-05 MED ORDER — ONDANSETRON HCL 4 MG/2ML IJ SOLN
INTRAMUSCULAR | Status: AC
  Filled 2017-10-05: qty 2

## 2017-10-05 MED ORDER — HEMOSTATIC AGENTS (NO CHARGE) OPTIME
TOPICAL | Status: DC | PRN
  Administered 2017-10-05: 1 via TOPICAL

## 2017-10-05 MED ORDER — ROCURONIUM BROMIDE 100 MG/10ML IV SOLN
INTRAVENOUS | Status: DC | PRN
  Administered 2017-10-05: 30 mg via INTRAVENOUS

## 2017-10-05 MED ORDER — LIDOCAINE HCL (CARDIAC) PF 100 MG/5ML IV SOSY
PREFILLED_SYRINGE | INTRAVENOUS | Status: DC | PRN
  Administered 2017-10-05: 50 mg via INTRAVENOUS

## 2017-10-05 MED ORDER — BUPIVACAINE LIPOSOME 1.3 % IJ SUSP
INTRAMUSCULAR | Status: DC | PRN
  Administered 2017-10-05: 20 mL

## 2017-10-05 MED ORDER — FENTANYL CITRATE (PF) 100 MCG/2ML IJ SOLN
INTRAMUSCULAR | Status: DC | PRN
  Administered 2017-10-05 (×2): 100 ug via INTRAVENOUS
  Administered 2017-10-05: 50 ug via INTRAVENOUS

## 2017-10-05 MED ORDER — MEPERIDINE HCL 50 MG/ML IJ SOLN: 6.2500 mg | INTRAMUSCULAR | Status: DC | PRN

## 2017-10-05 MED ORDER — LACTATED RINGERS IV SOLN
INTRAVENOUS | Status: DC
  Administered 2017-10-05: 10:00:00 via INTRAVENOUS

## 2017-10-05 MED ORDER — HYDROCODONE-ACETAMINOPHEN 7.5-325 MG PO TABS
1.0000 | ORAL_TABLET | Freq: Once | ORAL | Status: AC | PRN
  Administered 2017-10-05: 1 via ORAL
  Filled 2017-10-05: qty 1

## 2017-10-05 MED ORDER — PROPOFOL 10 MG/ML IV BOLUS
INTRAVENOUS | Status: DC | PRN
  Administered 2017-10-05: 200 mg via INTRAVENOUS

## 2017-10-05 MED ORDER — CIPROFLOXACIN IN D5W 400 MG/200ML IV SOLN
400.0000 mg | INTRAVENOUS | Status: AC
  Administered 2017-10-05: 400 mg via INTRAVENOUS
  Filled 2017-10-05: qty 200

## 2017-10-05 MED ORDER — POVIDONE-IODINE 10 % EX OINT
TOPICAL_OINTMENT | CUTANEOUS | Status: AC
  Filled 2017-10-05: qty 1

## 2017-10-05 MED ORDER — PROMETHAZINE HCL 25 MG/ML IJ SOLN: 6.2500 mg | INTRAMUSCULAR | Status: DC | PRN

## 2017-10-05 SURGICAL SUPPLY — 47 items
APPLIER CLIP ROT 10 11.4 M/L (STAPLE) ×3
BAG RETRIEVAL 10 (BASKET) ×1
BAG RETRIEVAL 10MM (BASKET) ×1
CHLORAPREP W/TINT 26ML (MISCELLANEOUS) ×3 IMPLANT
CLIP APPLIE ROT 10 11.4 M/L (STAPLE) ×1 IMPLANT
CLOTH BEACON ORANGE TIMEOUT ST (SAFETY) ×3 IMPLANT
COVER LIGHT HANDLE STERIS (MISCELLANEOUS) ×6 IMPLANT
ELECT REM PT RETURN 9FT ADLT (ELECTROSURGICAL) ×3
ELECTRODE REM PT RTRN 9FT ADLT (ELECTROSURGICAL) ×1 IMPLANT
FILTER SMOKE EVAC LAPAROSHD (FILTER) ×3 IMPLANT
GLOVE BIO SURGEON STRL SZ7 (GLOVE) ×6 IMPLANT
GLOVE BIOGEL PI IND STRL 7.0 (GLOVE) ×4 IMPLANT
GLOVE BIOGEL PI INDICATOR 7.0 (GLOVE) ×8
GLOVE ECLIPSE 6.5 STRL STRAW (GLOVE) ×3 IMPLANT
GLOVE SURG SS PI 7.5 STRL IVOR (GLOVE) ×3 IMPLANT
GOWN STRL REUS W/ TWL XL LVL3 (GOWN DISPOSABLE) ×1 IMPLANT
GOWN STRL REUS W/TWL LRG LVL3 (GOWN DISPOSABLE) ×6 IMPLANT
GOWN STRL REUS W/TWL XL LVL3 (GOWN DISPOSABLE) ×2
HEMOSTAT SNOW SURGICEL 2X4 (HEMOSTASIS) ×3 IMPLANT
INST SET LAPROSCOPIC AP (KITS) ×3 IMPLANT
IV NS IRRIG 3000ML ARTHROMATIC (IV SOLUTION) IMPLANT
KIT TURNOVER KIT A (KITS) ×3 IMPLANT
MANIFOLD NEPTUNE II (INSTRUMENTS) ×3 IMPLANT
NEEDLE HYPO 18GX1.5 BLUNT FILL (NEEDLE) ×3 IMPLANT
NEEDLE HYPO 22GX1.5 SAFETY (NEEDLE) ×3 IMPLANT
NEEDLE INSUFFLATION 14GA 120MM (NEEDLE) ×3 IMPLANT
NS IRRIG 1000ML POUR BTL (IV SOLUTION) ×3 IMPLANT
PACK LAP CHOLE LZT030E (CUSTOM PROCEDURE TRAY) ×3 IMPLANT
PAD ARMBOARD 7.5X6 YLW CONV (MISCELLANEOUS) ×3 IMPLANT
SET BASIN LINEN APH (SET/KITS/TRAYS/PACK) ×3 IMPLANT
SET TUBE IRRIG SUCTION NO TIP (IRRIGATION / IRRIGATOR) IMPLANT
SLEEVE ENDOPATH XCEL 5M (ENDOMECHANICALS) ×3 IMPLANT
SPONGE GAUZE 2X2 8PLY STER LF (GAUZE/BANDAGES/DRESSINGS) ×1
SPONGE GAUZE 2X2 8PLY STRL LF (GAUZE/BANDAGES/DRESSINGS) ×2 IMPLANT
STAPLER VISISTAT (STAPLE) ×3 IMPLANT
SUT VICRYL 0 UR6 27IN ABS (SUTURE) ×3 IMPLANT
SYR 20CC LL (SYRINGE) ×3 IMPLANT
SYS BAG RETRIEVAL 10MM (BASKET) ×1
SYSTEM BAG RETRIEVAL 10MM (BASKET) ×1 IMPLANT
TAPE PAPER 2X10 WHT MICROPORE (GAUZE/BANDAGES/DRESSINGS) ×3 IMPLANT
TROCAR ENDO BLADELESS 11MM (ENDOMECHANICALS) ×3 IMPLANT
TROCAR XCEL NON-BLD 5MMX100MML (ENDOMECHANICALS) ×3 IMPLANT
TROCAR XCEL UNIV SLVE 11M 100M (ENDOMECHANICALS) ×3 IMPLANT
TUBE CONNECTING 12'X1/4 (SUCTIONS) ×1
TUBE CONNECTING 12X1/4 (SUCTIONS) ×2 IMPLANT
TUBING INSUFFLATION (TUBING) ×3 IMPLANT
WARMER LAPAROSCOPE (MISCELLANEOUS) ×3 IMPLANT

## 2017-10-05 NOTE — Op Note (Signed)
Patient:  Jacqueline Patterson  DOB:  1970/07/08  MRN:  [id number]   Preop Diagnosis: Biliary colic, cholelithiasis  Postop Diagnosis: Same  Procedure: Laparoscopic cholecystectomy  Surgeon: Franky Macho, MD  Anes: General endotracheal  Indications: Patient is a 48 year old white female who presents with biliary colic secondary to cholelithiasis.  The risks and benefits of the procedure including bleeding, infection, hepatobiliary injury, and the possibility of an open procedure were fully explained to the patient, who gave informed consent.  Procedure note: The patient was placed in supine position.  After induction of general endotracheal anesthesia, the abdomen was prepped and draped using the usual sterile technique with DuraPrep.  Surgical site confirmation was performed.  A supraumbilical incision was made down to the fascia.  A Veress needle was introduced into the abdominal cavity and confirmation of placement was done using the saline drop test.  The abdomen was then insufflated to 16 mmHg pressure.  An 11 mm trocar was introduced into the abdominal cavity under direct visualization without difficulty.  The patient was placed in reverse Trendelenburg position and an additional 11 mm trocar was placed in the epigastric region and 5 mm trochars were placed the right upper quadrant and right flank regions.  Liver was inspected and noted to be within normal limits.  The gallbladder was retracted in a dynamic fashion in order to provide a critical view of the triangle of Calot.  The cystic duct was first identified.  Its juncture to the infundibulum was fully identified.  Endoclips were placed proximally and distally on the cystic duct, and the cystic duct was divided.  This was likewise done to the cystic artery.  The gallbladder was freed away from the gallbladder fossa using Bovie electrocautery.  The gallbladder was delivered through the epigastric trocar site using an Endo Catch bag.  The  gallbladder fossa was inspected and no abnormal bleeding or bile leakage was noted.  Surgicel was placed in the gallbladder fossa.  All fluid and air were then evacuated from the abdominal cavity prior to removal of the trochars.  All wounds were irrigated with normal saline.  All wounds were injected with Exparel.  The epigastric fascia was reapproximated using an 0 Vicryl interrupted suture.  All skin incisions were closed using staples.  Betadine ointment and dry sterile dressings were applied.  All tape and needle counts were correct at the end of the procedure.  The patient was extubated in the operating room and transferred to PACU in stable condition.  Complications: None  EBL: Minimal  Specimen: Gallbladder

## 2017-10-05 NOTE — Discharge Instructions (Signed)
Laparoscopic Cholecystectomy, Care After °This sheet gives you information about how to care for yourself after your procedure. Your health care provider may also give you more specific instructions. If you have problems or questions, contact your health care provider. °What can I expect after the procedure? °After the procedure, it is common to have: °· Pain at your incision sites. You will be given medicines to control this pain. °· Mild nausea or vomiting. °· Bloating and possible shoulder pain from the air-like gas that was used during the procedure. ° °Follow these instructions at home: °Incision care ° °· Follow instructions from your health care provider about how to take care of your incisions. Make sure you: °? Wash your hands with soap and water before you change your bandage (dressing). If soap and water are not available, use hand sanitizer. °? Change your dressing as told by your health care provider. °? Leave stitches (sutures), skin glue, or adhesive strips in place. These skin closures may need to be in place for 2 weeks or longer. If adhesive strip edges start to loosen and curl up, you may trim the loose edges. Do not remove adhesive strips completely unless your health care provider tells you to do that. °· Do not take baths, swim, or use a hot tub until your health care provider approves. Ask your health care provider if you can take showers. You may only be allowed to take sponge baths for bathing. °· Check your incision area every day for signs of infection. Check for: °? More redness, swelling, or pain. °? More fluid or blood. °? Warmth. °? Pus or a bad smell. °Activity °· Do not drive or use heavy machinery while taking prescription pain medicine. °· Do not lift anything that is heavier than 10 lb (4.5 kg) until your health care provider approves. °· Do not play contact sports until your health care provider approves. °· Do not drive for 24 hours if you were given a medicine to help you relax  (sedative). °· Rest as needed. Do not return to work or school until your health care provider approves. °General instructions °· Take over-the-counter and prescription medicines only as told by your health care provider. °· To prevent or treat constipation while you are taking prescription pain medicine, your health care provider may recommend that you: °? Drink enough fluid to keep your urine clear or pale yellow. °? Take over-the-counter or prescription medicines. °? Eat foods that are high in fiber, such as fresh fruits and vegetables, whole grains, and beans. °? Limit foods that are high in fat and processed sugars, such as fried and sweet foods. °Contact a health care provider if: °· You develop a rash. °· You have more redness, swelling, or pain around your incisions. °· You have more fluid or blood coming from your incisions. °· Your incisions feel warm to the touch. °· You have pus or a bad smell coming from your incisions. °· You have a fever. °· One or more of your incisions breaks open. °Get help right away if: °· You have trouble breathing. °· You have chest pain. °· You have increasing pain in your shoulders. °· You faint or feel dizzy when you stand. °· You have severe pain in your abdomen. °· You have nausea or vomiting that lasts for more than one day. °· You have leg pain. °This information is not intended to replace advice given to you by your health care provider. Make sure you discuss any questions you   have with your health care provider. °Document Released: 01/16/2005 Document Revised: 08/07/2015 Document Reviewed: 07/05/2015 °Elsevier Interactive Patient Education © 2018 [employer] °PATIENT INSTRUCTIONS °POST-ANESTHESIA ° °IMMEDIATELY FOLLOWING SURGERY:  Do not drive or operate machinery for the first twenty four hours after surgery.  Do not make any important decisions for twenty four hours after surgery or while taking narcotic pain medications or sedatives.  If you develop intractable  nausea and vomiting or a severe headache please notify your doctor immediately. ° °FOLLOW-UP:  Please make an appointment with your surgeon as instructed. You do not need to follow up with anesthesia unless specifically instructed to do so. ° °WOUND CARE INSTRUCTIONS (if applicable):  Keep a dry clean dressing on the anesthesia/puncture wound site if there is drainage.  Once the wound has quit draining you may leave it open to air.  Generally you should leave the bandage intact for twenty four hours unless there is drainage.  If the epidural site drains for more than 36-48 hours please call the anesthesia department. ° °QUESTIONS?:  Please feel free to call your physician or the hospital operator if you have any questions, and they will be happy to assist you.    ° ° ° ° °

## 2017-10-05 NOTE — Anesthesia Postprocedure Evaluation (Signed)
Anesthesia Post Note  Patient: Jacqueline Patterson  Procedure(s) Performed: LAPAROSCOPIC CHOLECYSTECTOMY (N/A Abdomen)  Patient location during evaluation: PACU Anesthesia Type: General Level of consciousness: awake, awake and alert and oriented Pain management: pain level controlled Vital Signs Assessment: post-procedure vital signs reviewed and stable Respiratory status: spontaneous breathing, nonlabored ventilation and respiratory function stable Cardiovascular status: stable Postop Assessment: patient able to bend at knees Anesthetic complications: no     Last Vitals:  Vitals:   10/05/17 0911 10/05/17 1051  BP: 135/79 134/89  Pulse: 69 65  Resp: 20 17  Temp: 36.7 C 36.7 C  SpO2: 99% 99%    Last Pain:  Vitals:   10/05/17 1051  TempSrc:   PainSc: (P) 3                  Anjuli Gemmill

## 2017-10-05 NOTE — Anesthesia Procedure Notes (Signed)
Procedure Name: Intubation Date/Time: 10/05/2017 10:09 AM Performed by: Jonna Munro, CRNA Pre-anesthesia Checklist: Patient identified, Emergency Drugs available, Suction available, Patient being monitored and Timeout performed Patient Re-evaluated:Patient Re-evaluated prior to induction Oxygen Delivery Method: Circle system utilized Preoxygenation: Pre-oxygenation with 100% oxygen Induction Type: IV induction Ventilation: Mask ventilation without difficulty Laryngoscope Size: Mac and 3 Grade View: Grade I Tube type: Oral Tube size: 7.0 mm Number of attempts: 1 Airway Equipment and Method: Stylet Placement Confirmation: ETT inserted through vocal cords under direct vision,  positive ETCO2 and breath sounds checked- equal and bilateral Secured at: 22 cm Tube secured with: Tape Dental Injury: Teeth and Oropharynx as per pre-operative assessment

## 2017-10-05 NOTE — Transfer of Care (Signed)
Immediate Anesthesia Transfer of Care Note  Patient: Jacqueline Patterson  Procedure(s) Performed: LAPAROSCOPIC CHOLECYSTECTOMY (N/A Abdomen)  Patient Location: PACU  Anesthesia Type:General  Level of Consciousness: awake, alert  and oriented  Airway & Oxygen Therapy: Patient Spontanous Breathing and Patient connected to nasal cannula oxygen  Post-op Assessment: Report given to RN and Post -op Vital signs reviewed and stable  Post vital signs: Reviewed and stable  Last Vitals:  Vitals Value Taken Time  BP 134/89 10/05/2017 10:51 AM  Temp    Pulse 65 10/05/2017 10:54 AM  Resp 11 10/05/2017 10:54 AM  SpO2 98 % 10/05/2017 10:54 AM  Vitals shown include unvalidated device data.  Last Pain:  Vitals:   10/05/17 0911  TempSrc: Oral  PainSc:       Patients Stated Pain Goal: 7 (10/05/17 0854)  Complications: No apparent anesthesia complications

## 2017-10-05 NOTE — Anesthesia Preprocedure Evaluation (Signed)
Anesthesia Evaluation  Patient identified by MRN, date of birth, ID band Patient awake    Reviewed: Allergy & Precautions, H&P , NPO status , Patient's Chart, lab work & pertinent test results, reviewed documented beta blocker date and time   Airway Mallampati: III  TM Distance: >3 FB Neck ROM: full    Dental no notable dental hx. (+) Teeth Intact, Dental Advidsory Given   Pulmonary neg pulmonary ROS,    Pulmonary exam normal breath sounds clear to auscultation       Cardiovascular Exercise Tolerance: Good negative cardio ROS   Rhythm:regular Rate:Normal     Neuro/Psych negative neurological ROS  negative psych ROS   GI/Hepatic negative GI ROS, Neg liver ROS,   Endo/Other  negative endocrine ROS  Renal/GU negative Renal ROS  negative genitourinary   Musculoskeletal   Abdominal   Peds  Hematology negative hematology ROS (+)   Anesthesia Other Findings 0915 repeat HCG pending from previous in early Aug Reported HCG negative Persistent N/V taking ondansetron obesity  Reproductive/Obstetrics negative OB ROS                             Anesthesia Physical Anesthesia Plan  ASA: II  Anesthesia Plan: General   Post-op Pain Management:    Induction:   PONV Risk Score and Plan:   Airway Management Planned:   Additional Equipment:   Intra-op Plan:   Post-operative Plan:   Informed Consent: I have reviewed the patients History and Physical, chart, labs and discussed the procedure including the risks, benefits and alternatives for the proposed anesthesia with the patient or authorized representative who has indicated his/her understanding and acceptance.   Dental Advisory Given  Plan Discussed with: CRNA and Anesthesiologist  Anesthesia Plan Comments:         Anesthesia Quick Evaluation

## 2017-10-05 NOTE — Interval H&P Note (Signed)
History and Physical Interval Note:  10/05/2017 9:51 AM  Jacqueline Patterson  has presented today for surgery, with the diagnosis of cholelithiasis  The various methods of treatment have been discussed with the patient and family. After consideration of risks, benefits and other options for treatment, the patient has consented to  Procedure(s): LAPAROSCOPIC CHOLECYSTECTOMY (N/A) as a surgical intervention .  The patient's history has been reviewed, patient examined, no change in status, stable for surgery.  I have reviewed the patient's chart and labs.  Questions were answered to the patient's satisfaction.     Franky Macho

## 2017-10-08 ENCOUNTER — Encounter (HOSPITAL_COMMUNITY): Payer: Self-pay | Admitting: General Surgery

## 2017-10-18 ENCOUNTER — Ambulatory Visit (INDEPENDENT_AMBULATORY_CARE_PROVIDER_SITE_OTHER): Payer: Self-pay | Admitting: General Surgery

## 2017-10-18 ENCOUNTER — Encounter: Payer: Self-pay | Admitting: General Surgery

## 2017-10-18 VITALS — BP 154/89 | HR 79 | Temp 97.7°F | Resp 20 | Wt 226.0 lb

## 2017-10-18 DIAGNOSIS — Z09 Encounter for follow-up examination after completed treatment for conditions other than malignant neoplasm: Secondary | ICD-10-CM

## 2017-10-18 NOTE — Progress Notes (Signed)
Subjective:     Jacqueline Patterson  Status post laparoscopic cholecystectomy.  Doing well.  Has no complaints. Objective:    BP (!) 154/89 (BP Location: Left Arm, Patient Position: Sitting, Cuff Size: Large)   Pulse 79   Temp 97.7 F (36.5 C) (Temporal)   Resp 20   Wt 226 lb (102.5 kg)   BMI 38.79 kg/m   General:  alert, cooperative and no distress  Abdomen soft, incisions healing well.  Staples removed, Steri-Strips applied. Final pathology consistent with diagnosis.     Assessment:    Doing well postoperatively.    Plan:   May resume normal activity.  Follow-up here as needed.

## 2018-07-31 ENCOUNTER — Emergency Department (HOSPITAL_COMMUNITY): Payer: Self-pay

## 2018-07-31 ENCOUNTER — Other Ambulatory Visit: Payer: Self-pay

## 2018-07-31 ENCOUNTER — Emergency Department (HOSPITAL_COMMUNITY)
Admission: EM | Admit: 2018-07-31 | Discharge: 2018-07-31 | Disposition: A | Discharge status: Home / Self Care | Payer: Self-pay | Attending: Emergency Medicine | Admitting: Emergency Medicine

## 2018-07-31 ENCOUNTER — Encounter (HOSPITAL_COMMUNITY): Payer: Self-pay | Admitting: Emergency Medicine

## 2018-07-31 DIAGNOSIS — R220 Localized swelling, mass and lump, head: Secondary | ICD-10-CM

## 2018-07-31 DIAGNOSIS — K047 Periapical abscess without sinus: Secondary | ICD-10-CM

## 2018-07-31 LAB — I-STAT CREATININE, ED: Creatinine, Ser: 0.6 mg/dL (ref 0.44–1.00)

## 2018-07-31 MED ORDER — IBUPROFEN 400 MG PO TABS
400.0000 mg | ORAL_TABLET | Freq: Once | ORAL | Status: AC
  Administered 2018-07-31: 400 mg via ORAL
  Filled 2018-07-31: qty 1

## 2018-07-31 MED ORDER — IOHEXOL 300 MG/ML  SOLN
75.0000 mL | Freq: Once | INTRAMUSCULAR | Status: AC | PRN
  Administered 2018-07-31: 75 mL via INTRAVENOUS

## 2018-07-31 MED ORDER — CLINDAMYCIN HCL 150 MG PO CAPS
300.0000 mg | ORAL_CAPSULE | Freq: Once | ORAL | Status: AC
  Administered 2018-07-31: 300 mg via ORAL
  Filled 2018-07-31: qty 2

## 2018-07-31 MED ORDER — HYDROCODONE-ACETAMINOPHEN 5-325 MG PO TABS
1.0000 | ORAL_TABLET | Freq: Once | ORAL | Status: AC
  Administered 2018-07-31: 05:00:00 1 via ORAL
  Filled 2018-07-31: qty 1

## 2018-07-31 MED ORDER — FENTANYL CITRATE (PF) 100 MCG/2ML IJ SOLN
100.0000 ug | Freq: Once | INTRAMUSCULAR | Status: AC
  Administered 2018-07-31: 100 ug via INTRAVENOUS
  Filled 2018-07-31: qty 2

## 2018-07-31 MED ORDER — AMOXICILLIN-POT CLAVULANATE 875-125 MG PO TABS
1.0000 | ORAL_TABLET | Freq: Once | ORAL | Status: AC
  Administered 2018-07-31: 1 via ORAL
  Filled 2018-07-31: qty 1

## 2018-07-31 MED ORDER — HYDROCODONE-ACETAMINOPHEN 5-325 MG PO TABS: 1.0000 | ORAL_TABLET | Freq: Four times a day (QID) | ORAL | 0 refills | Status: DC | PRN

## 2018-07-31 MED ORDER — AMOXICILLIN-POT CLAVULANATE 875-125 MG PO TABS: 1.0000 | ORAL_TABLET | Freq: Two times a day (BID) | ORAL | 0 refills | Status: DC

## 2018-07-31 NOTE — ED Provider Notes (Signed)
[facility] EMERGENCY DEPARTMENT Provider Note   CSN: [id number] Arrival date & time: 07/31/18  0435     History   Chief Complaint Chief Complaint  Patient presents with  . Facial Swelling    HPI Jacqueline Patterson is a 48 y.o. female.     The history is provided by the patient.  Dental Pain Location:  Upper Quality:  Constant Severity:  Moderate Onset quality:  Gradual Duration:  1 day Timing:  Constant Progression:  Worsening Chronicity:  New Context comment:  "Chipped tooth" Relieved by:  Nothing Worsened by:  Jaw movement and pressure Associated symptoms: facial pain and facial swelling   Associated symptoms: no difficulty swallowing, no drooling, no fever, no neck swelling and no trismus    Patient reports she chipped a tooth yesterday and began having pain.  She woke up tonight with right-sided facial swelling.  No fever/vomiting.  No difficulty swallowing. She reports she was planning on having all of her teeth removed, but has been unable to see her dentist    Patient Active Problem List   Diagnosis Date Noted  . Calculus of gallbladder without cholecystitis without obstruction     Past Surgical History:  Procedure Laterality Date  . BACK SURGERY    . CESAREAN SECTION     x2  . CHOLECYSTECTOMY N/A 10/05/2017   Procedure: LAPAROSCOPIC CHOLECYSTECTOMY;  Surgeon: Franky MachoJenkins, Mark, MD;  Location: [facility];  Service: General;  Laterality: N/A;  . NECK SURGERY    . TUBAL LIGATION  2001   with c-section     OB History    Gravida  2   Para  2   Term  2   Preterm      AB      Living  2     SAB      TAB      Ectopic      Multiple      Live Births               Home Medications    Prior to Admission medications   Not on File    Family History Family History  Problem Relation Age of Onset  . Cancer Mother   . Stroke Other   . Seizures Other     Social History Social History   Tobacco Use  . Smoking status: Never Smoker  .  Smokeless tobacco: Never Used  Substance Use Topics  . Alcohol use: No  . Drug use: No     Allergies   Patient has no known allergies.   Review of Systems Review of Systems  Constitutional: Negative for fever.  HENT: Positive for facial swelling. Negative for drooling and trouble swallowing.   Gastrointestinal: Negative for vomiting.  All other systems reviewed and are negative.    Physical Exam Updated Vital Signs BP (!) 177/93 (BP Location: Left Arm)   Pulse 77   Temp 98.6 F (37 C) (Oral)   Resp 16   Ht 1.626 m (5\' 4" )   Wt 104.3 kg   LMP 07/24/2018   SpO2 99%   BMI 39.48 kg/m   Physical Exam CONSTITUTIONAL: Well developed/well nourished HEAD: Normocephalic/atraumatic EYES: EOMI/PERRL, no proptosis ENMT: Mucous membranes moist, no erythema to the nose, no abscess to nose, significant soft tissue swelling of the right side of her face without erythema or rash.  No abscess/induration noted.  No crepitus.  See photo below. Poor dentition is noted with tenderness throughout the gingiva of the right  maxilla.  No gingival abscess noted.  No trismus, no drooling, no stridor, floor of mouth is soft NECK: supple no meningeal signs, no anterior neck swelling LUNGS: no apparent distress ABDOMEN: soft, obese NEURO: Pt is awake/alert/appropriate, moves all extremitiesx4.  No facial droop.   EXTREMITIES:  full ROM SKIN: warm, color normal PSYCH: no abnormalities of mood noted, alert and oriented to situation     Patient gave verbal permission to utilize photo for medical documentation only The image was not stored on any personal device  ED Treatments / Results  Labs (all labs ordered are listed, but only abnormal results are displayed) Labs Reviewed  I-STAT CREATININE, ED    EKG None  Radiology Ct Maxillofacial W Contrast  Result Date: 07/31/2018 CLINICAL DATA:  Right-sided facial pain and swelling that started yesterday EXAM: CT MAXILLOFACIAL WITH CONTRAST  TECHNIQUE: Multidetector CT imaging of the maxillofacial structures was performed with intravenous contrast. Multiplanar CT image reconstructions were also generated. CONTRAST:  75mL OMNIPAQUE IOHEXOL 300 MG/ML  SOLN COMPARISON:  None. FINDINGS: Osseous: Multiple dental cavities, most notably the remaining upper right molar which has a periapical erosion extending through the buccal cortex with adjacent soft tissue stranding that continues throughout the right cheek. No evident collection. No soft tissue emphysema or involvement of floor of mouth. Orbits: Negative. No traumatic or inflammatory finding. Sinuses: Mild mucosal thickening in the right maxillary sinus Soft tissues: Negative. Limited intracranial: No significant or unexpected finding. IMPRESSION: Right facial cellulitis that is likely odontogenic from tooth number 1 or 2. No drainable collection. Electronically Signed   By: Marnee SpringJonathon  Watts M.D.   On: 07/31/2018 06:37    Procedures Procedures    Medications Ordered in ED Medications  clindamycin (CLEOCIN) capsule 300 mg (300 mg Oral Given 07/31/18 0509)  HYDROcodone-acetaminophen (NORCO/VICODIN) 5-325 MG per tablet 1 tablet (1 tablet Oral Given 07/31/18 0510)  ibuprofen (ADVIL) tablet 400 mg (400 mg Oral Given 07/31/18 0510)  amoxicillin-clavulanate (AUGMENTIN) 875-125 MG per tablet 1 tablet (1 tablet Oral Given 07/31/18 0530)  iohexol (OMNIPAQUE) 300 MG/ML solution 75 mL (75 mLs Intravenous Contrast Given 07/31/18 0611)  fentaNYL (SUBLIMAZE) injection 100 mcg (100 mcg Intravenous Given 07/31/18 0650)     Initial Impression / Assessment and Plan / ED Course  I have reviewed the triage vital signs and the nursing notes. Pertinent labs/imaging were reviewed during ED stay     5:08 AM Patient presents with right facial swelling.  This is likely due to a dental abscess, but there is no gingival abscess for me to I&D. No signs of any cutaneous abscess.  No signs of orbital invasion, she has good  extraocular movements 5:21 AM Further discussion, we will proceed with CT imaging of her face.  This will likely help for any surgical planning.  She will also need to call the oral surgeon on-call after she leaves the ER We will also switch clindamycin to Augmentin due to twice a day dosing 6:53 AM CT imaging results noted.  She will be referred to oral surgery. We discussed strict ER return precautions Final Clinical Impressions(s) / ED Diagnoses   Final diagnoses:  Facial swelling  Dental infection    ED Discharge Orders         Ordered    amoxicillin-clavulanate (AUGMENTIN) 875-125 MG tablet  2 times daily     07/31/18 0523    HYDROcodone-acetaminophen (NORCO/VICODIN) 5-325 MG tablet  Every 6 hours PRN     07/31/18 0523  Ripley Fraise, MD 07/31/18 [id number]

## 2018-07-31 NOTE — Discharge Instructions (Addendum)
PLEASE CALL DR JENSEN IN [place] TODAY FOR A CLOSE FOLLOWUP AS YOU MAY NEED SURGERY FOR YOUR INFECTION

## 2018-07-31 NOTE — ED Triage Notes (Signed)
Pt C/O right sided facial swelling and pain that started yesterday. Pt states she chipped a tooth yesterday and "woke up this morning with the right side of my facial swollen."

## 2018-08-01 ENCOUNTER — Encounter (HOSPITAL_COMMUNITY): Payer: Self-pay | Admitting: Emergency Medicine

## 2018-08-01 ENCOUNTER — Other Ambulatory Visit: Payer: Self-pay

## 2018-08-01 ENCOUNTER — Emergency Department (HOSPITAL_COMMUNITY)
Admission: EM | Admit: 2018-08-01 | Discharge: 2018-08-01 | Disposition: A | Discharge status: Home / Self Care | Payer: Medicaid Other | Attending: Emergency Medicine | Admitting: Emergency Medicine

## 2018-08-01 DIAGNOSIS — L03211 Cellulitis of face: Secondary | ICD-10-CM

## 2018-08-01 LAB — BASIC METABOLIC PANEL
Anion gap: 12 (ref 5–15)
BUN: 9 mg/dL (ref 6–20)
CO2: 22 mmol/L (ref 22–32)
Calcium: 8.7 mg/dL — ABNORMAL LOW (ref 8.9–10.3)
Chloride: 100 mmol/L (ref 98–111)
Creatinine, Ser: 0.74 mg/dL (ref 0.44–1.00)
GFR calc Af Amer: >60 mL/min (ref >60)
GFR calc non Af Amer: >60 mL/min (ref >60)
Glucose, Bld: 109 mg/dL — ABNORMAL HIGH (ref 70–99)
Potassium: 3.4 mmol/L — ABNORMAL LOW (ref 3.5–5.1)
Sodium: 134 mmol/L — ABNORMAL LOW (ref 135–145)

## 2018-08-01 LAB — CBC WITH DIFFERENTIAL/PLATELET
Abs Immature Granulocytes: 0.04 10*3/uL (ref 0.00–0.07)
Basophils Absolute: 0.1 10*3/uL (ref 0.0–0.1)
Basophils Relative: 1 %
Eosinophils Absolute: 0.3 10*3/uL (ref 0.0–0.5)
Eosinophils Relative: 3 %
HCT: 32.6 % — ABNORMAL LOW (ref 36.0–46.0)
Hemoglobin: 10.1 g/dL — ABNORMAL LOW (ref 12.0–15.0)
Immature Granulocytes: 0 %
Lymphocytes Relative: 21 %
Lymphs Abs: 1.9 10*3/uL (ref 0.7–4.0)
MCH: 27.3 pg (ref 26.0–34.0)
MCHC: 31 g/dL (ref 30.0–36.0)
MCV: 88.1 fL (ref 80.0–100.0)
Monocytes Absolute: 0.9 10*3/uL (ref 0.1–1.0)
Monocytes Relative: 10 %
Neutro Abs: 5.8 10*3/uL (ref 1.7–7.7)
Neutrophils Relative %: 65 %
Platelets: 349 10*3/uL (ref 150–400)
RBC: 3.7 MIL/uL — ABNORMAL LOW (ref 3.87–5.11)
RDW: 18.2 % — ABNORMAL HIGH (ref 11.5–15.5)
WBC: 8.9 10*3/uL (ref 4.0–10.5)
nRBC: 0 % (ref 0.0–0.2)

## 2018-08-01 MED ORDER — CLINDAMYCIN HCL 150 MG PO CAPS: 300.0000 mg | ORAL_CAPSULE | Freq: Four times a day (QID) | ORAL | 0 refills | Status: AC

## 2018-08-01 MED ORDER — CLINDAMYCIN HCL 150 MG PO CAPS
300.0000 mg | ORAL_CAPSULE | Freq: Once | ORAL | Status: AC
  Administered 2018-08-01: 300 mg via ORAL
  Filled 2018-08-01: qty 2

## 2018-08-01 MED ORDER — OXYCODONE-ACETAMINOPHEN 5-325 MG PO TABS
1.0000 | ORAL_TABLET | Freq: Once | ORAL | Status: AC
  Administered 2018-08-01: 1 via ORAL
  Filled 2018-08-01: qty 1

## 2018-08-01 MED ORDER — OXYCODONE-ACETAMINOPHEN 5-325 MG PO TABS: 1.0000 | ORAL_TABLET | Freq: Four times a day (QID) | ORAL | 0 refills | Status: DC | PRN

## 2018-08-01 MED ORDER — OXYCODONE-ACETAMINOPHEN 5-325 MG PO TABS: 1.0000 | ORAL_TABLET | Freq: Four times a day (QID) | ORAL | 0 refills | Status: AC | PRN

## 2018-08-01 NOTE — Discharge Instructions (Signed)

## 2018-08-01 NOTE — ED Triage Notes (Signed)
Patient complaining of increased swelling to right side of face x 2 days. States she was treated here yesterday for same and told to return if worsened.

## 2018-08-01 NOTE — ED Provider Notes (Signed)
[facility] EMERGENCY DEPARTMENT Provider Note   CSN: [id number] Arrival date & time: 08/01/18  1609     History   Chief Complaint Chief Complaint  Patient presents with   Facial Swelling    HPI Jacqueline Patterson is a 48 y.o. female.  Who presents emergency department with odontogenic infection and facial cellulitis.  She was seen yesterday morning for the same complaint had a full work-up here in the emergency department including CT scan of face and neck.  No obvious sign of drainable abscess.  The patient returns for worsening of her swelling and extension into the neck.  She has some mild pain with swallowing of her large pills but denies voice change, drooling, difficulty breathing, severe pain with swallowing, difficulty turning her neck.  She has had 4 doses of Augmentin, 1 dose of clindamycin in the emergency department.  She states she was given Norco at discharge which is only given her minimal relief.  She denies trismus.     HPI  History reviewed. No pertinent past medical history.  Patient Active Problem List   Diagnosis Date Noted   Calculus of gallbladder without cholecystitis without obstruction     Past Surgical History:  Procedure Laterality Date   BACK SURGERY     CESAREAN SECTION     x2   CHOLECYSTECTOMY N/A 10/05/2017   Procedure: LAPAROSCOPIC CHOLECYSTECTOMY;  Surgeon: Franky MachoJenkins, Mark, MD;  Location: [facility];  Service: General;  Laterality: N/A;   NECK SURGERY     TUBAL LIGATION  2001   with c-section     OB History    Gravida  2   Para  2   Term  2   Preterm      AB      Living  2     SAB      TAB      Ectopic      Multiple      Live Births               Home Medications    Prior to Admission medications   Medication Sig Start Date End Date Taking? Authorizing Provider  clindamycin (CLEOCIN) 150 MG capsule Take 2 capsules (300 mg total) by mouth 4 (four) times daily. May dispense as 150mg  capsules 08/01/18   Arthor CaptainHarris,  Deliana Avalos, PA-C  oxyCODONE-acetaminophen (PERCOCET) 5-325 MG tablet Take 1 tablet by mouth every 6 (six) hours as needed for severe pain. 08/01/18   Arthor CaptainHarris, Burhan Barham, PA-C    Family History Family History  Problem Relation Age of Onset   Cancer Mother    Stroke Other    Seizures Other     Social History Social History   Tobacco Use   Smoking status: Never Smoker   Smokeless tobacco: Never Used  Substance Use Topics   Alcohol use: No   Drug use: No     Allergies   Patient has no known allergies.   Review of Systems Review of Systems Ten systems reviewed and are negative for acute change, except as noted in the HPI.    Physical Exam Updated Vital Signs BP 133/69 (BP Location: Right Arm)    Pulse 75    Temp 99.4 F (37.4 C) (Oral)    Resp 16    Ht 5\' 4"  (1.626 m)    Wt 90.7 kg    LMP 07/24/2018    SpO2 100%    BMI 34.33 kg/m   Physical Exam Vitals signs and nursing note reviewed.  Constitutional:      General: She is not in acute distress.    Appearance: She is well-developed. She is not diaphoretic.  HENT:     Head: Normocephalic and atraumatic.     Comments: Significant swelling on the right side of face ranging from just up under the eye down to the middle part of the neck.  There is an area of well demarcated erythema which I marked with a skin pen.  No obvious area of drainable abscess or fluctuance.    Mouth/Throat:     Mouth: Mucous membranes are moist.  Eyes:     General: No scleral icterus.    Conjunctiva/sclera: Conjunctivae normal.  Neck:     Musculoskeletal: Normal range of motion.  Cardiovascular:     Rate and Rhythm: Normal rate and regular rhythm.     Heart sounds: Normal heart sounds. No murmur. No friction rub. No gallop.   Pulmonary:     Effort: Pulmonary effort is normal. No respiratory distress.     Breath sounds: Normal breath sounds.  Abdominal:     General: Bowel sounds are normal. There is no distension.     Palpations: Abdomen is  soft. There is no mass.     Tenderness: There is no abdominal tenderness. There is no guarding.  Skin:    General: Skin is warm and dry.  Neurological:     Mental Status: She is alert and oriented to person, place, and time.  Psychiatric:        Behavior: Behavior normal.          ED Treatments / Results  Labs (all labs ordered are listed, but only abnormal results are displayed) Labs Reviewed  CBC WITH DIFFERENTIAL/PLATELET - Abnormal; Notable for the following components:      Result Value   RBC 3.70 (*)    Hemoglobin 10.1 (*)    HCT 32.6 (*)    RDW 18.2 (*)    All other components within normal limits  BASIC METABOLIC PANEL - Abnormal; Notable for the following components:   Sodium 134 (*)    Potassium 3.4 (*)    Glucose, Bld 109 (*)    Calcium 8.7 (*)    All other components within normal limits    EKG None  Radiology Ct Maxillofacial W Contrast  Result Date: 07/31/2018 CLINICAL DATA:  Right-sided facial pain and swelling that started yesterday EXAM: CT MAXILLOFACIAL WITH CONTRAST TECHNIQUE: Multidetector CT imaging of the maxillofacial structures was performed with intravenous contrast. Multiplanar CT image reconstructions were also generated. CONTRAST:  75mL OMNIPAQUE IOHEXOL 300 MG/ML  SOLN COMPARISON:  None. FINDINGS: Osseous: Multiple dental cavities, most notably the remaining upper right molar which has a periapical erosion extending through the buccal cortex with adjacent soft tissue stranding that continues throughout the right cheek. No evident collection. No soft tissue emphysema or involvement of floor of mouth. Orbits: Negative. No traumatic or inflammatory finding. Sinuses: Mild mucosal thickening in the right maxillary sinus Soft tissues: Negative. Limited intracranial: No significant or unexpected finding. IMPRESSION: Right facial cellulitis that is likely odontogenic from tooth number 1 or 2. No drainable collection. Electronically Signed   By: Marnee SpringJonathon   Watts M.D.   On: 07/31/2018 06:37    Procedures Procedures (including critical care time)  Medications Ordered in ED Medications  clindamycin (CLEOCIN) capsule 300 mg (300 mg Oral Given 08/01/18 1957)  oxyCODONE-acetaminophen (PERCOCET/ROXICET) 5-325 MG per tablet 1 tablet (1 tablet Oral Given 08/01/18 1958)     Initial  Impression / Assessment and Plan / ED Course  I have reviewed the triage vital signs and the nursing notes.  Pertinent labs & imaging results that were available during my care of the patient were reviewed by me and considered in my medical decision making (see chart for details).        Patient returns for reevaluation of odontogenic infection and facial cellulitis.  I discussed the case with Dr. Eulis Foster and Dr. Jodi Marble.  Will change the patient to 4 times daily clindamycin and increase her pain medication for improved tolerance.  I discussed reasons to seek immediate medical return and patient should be seen within the next 48 hours if she is having significant worsening or if in 48 hours she has no improvement in her symptoms.  Patient has a follow-up appointment with Dr. Hoyt Koch of oral surgery next Tuesday to lie 08/19/2018.  Final Clinical Impressions(s) / ED Diagnoses   Final diagnoses:  Facial cellulitis    ED Discharge Orders         Ordered    clindamycin (CLEOCIN) 150 MG capsule  4 times daily     08/01/18 1953    oxyCODONE-acetaminophen (PERCOCET) 5-325 MG tablet  Every 6 hours PRN,   Status:  Discontinued     08/01/18 1953    oxyCODONE-acetaminophen (PERCOCET) 5-325 MG tablet  Every 6 hours PRN     08/01/18 1956           Margarita Mail, PA-C 08/01/18 2240    Daleen Bo, MD 08/01/18 2329

## 2019-05-16 IMAGING — US US ABDOMEN COMPLETE
1 series · 13 of 25 positions shown · non-contrast
Comparison: None.

CLINICAL DATA: 47-year-old female with abdominal pain and nausea
for 6 weeks.

EXAM:
ABDOMEN ULTRASOUND COMPLETE

[Series 1: us abdomen complete · 0.24mm/px · 13 of 73 slices shown]
[im 1/73]
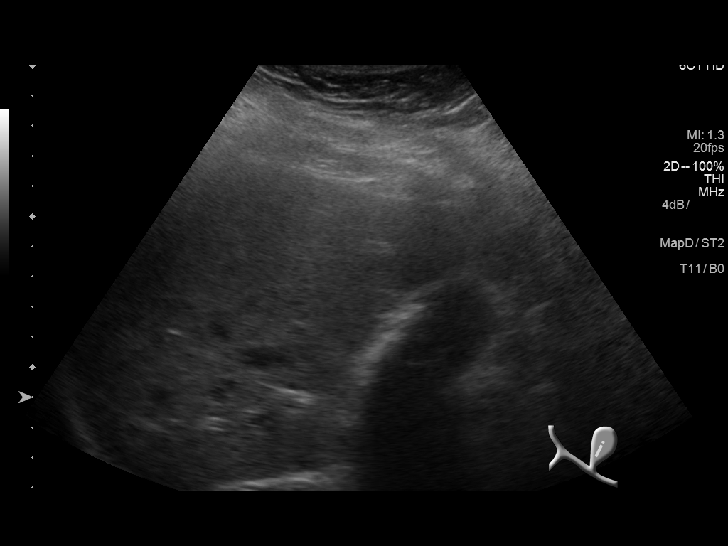
[im 7/73]
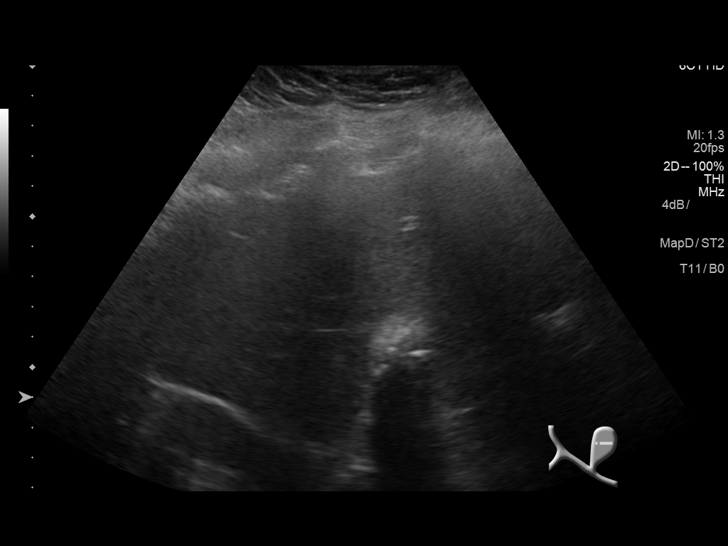
[im 13/73]
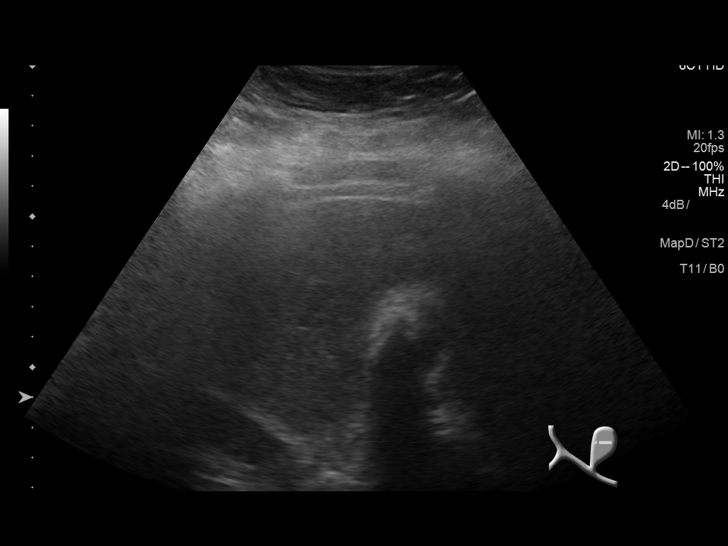
[im 19/73]
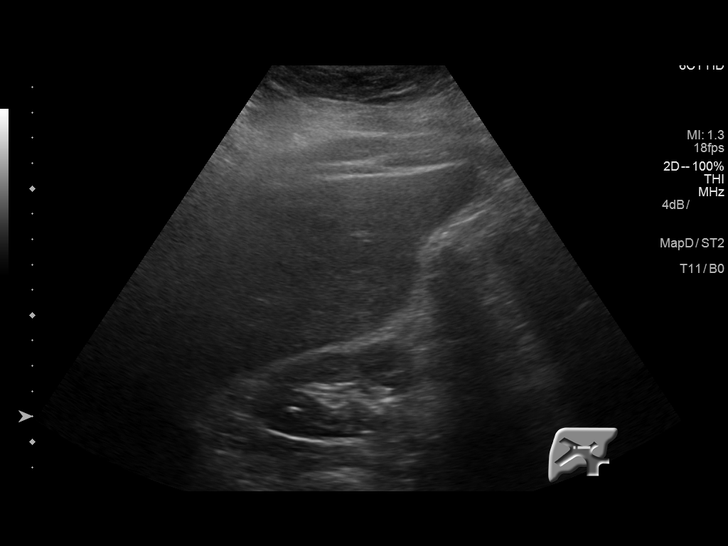
[im 25/73]
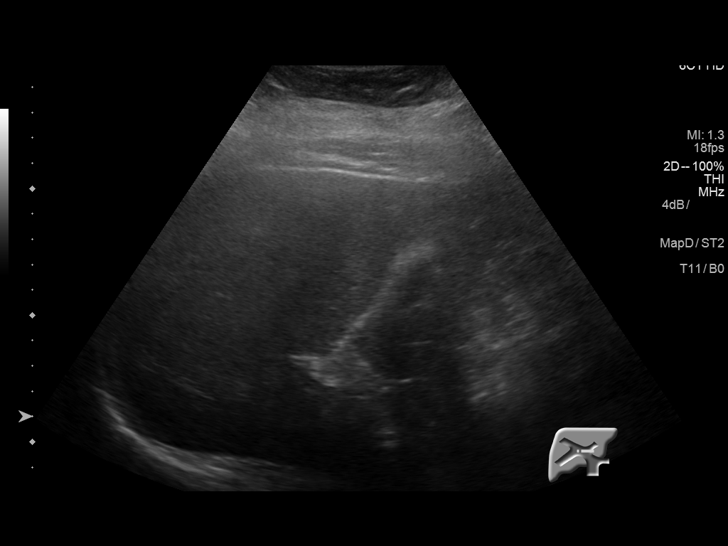
[im 31/73]
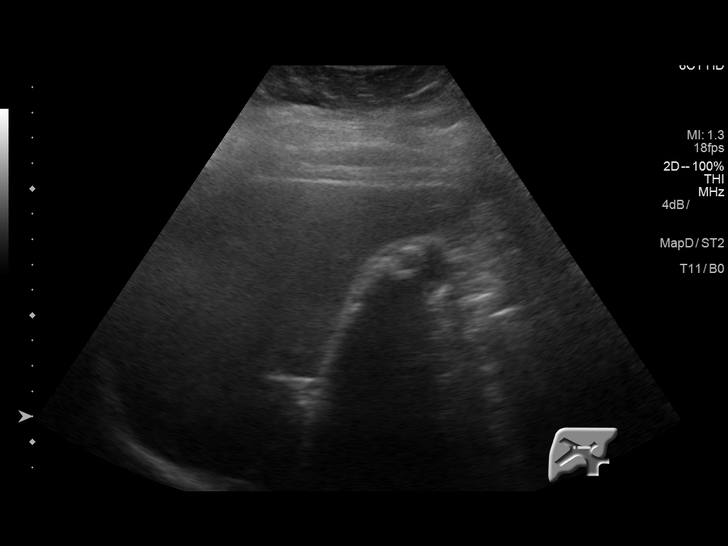
[im 37/73]
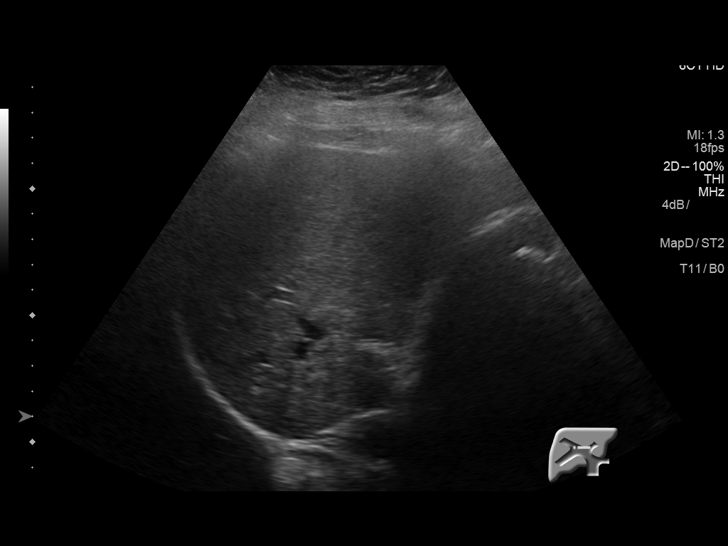
[im 43/73]
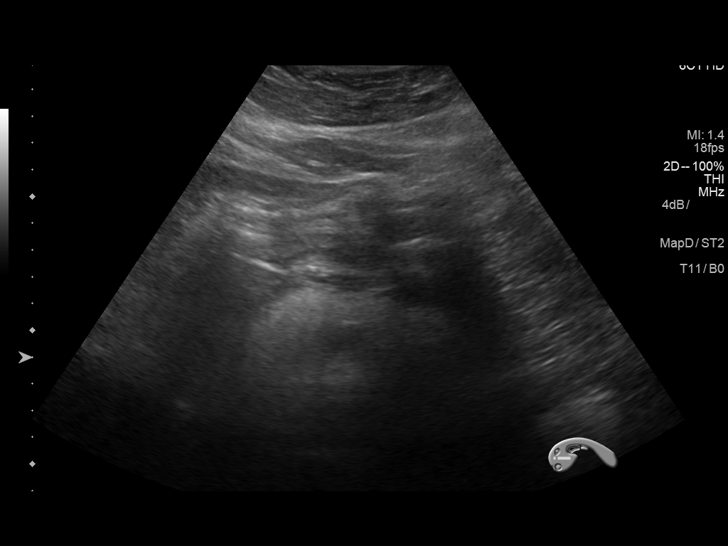
[im 49/73]
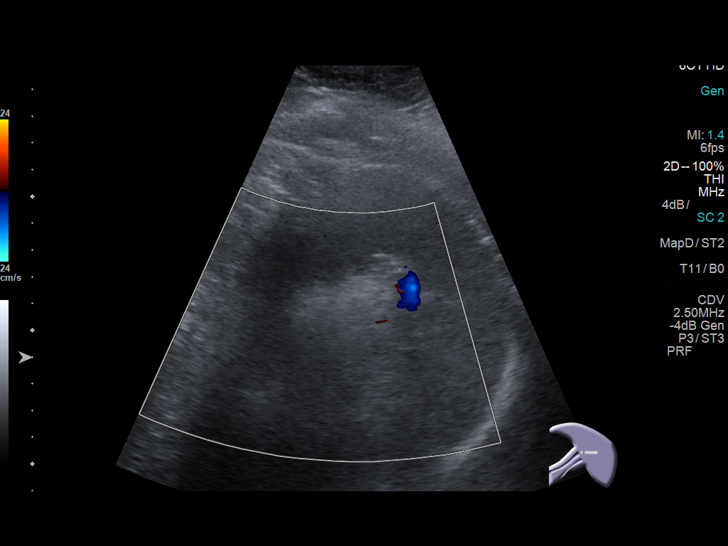
[im 55/73]
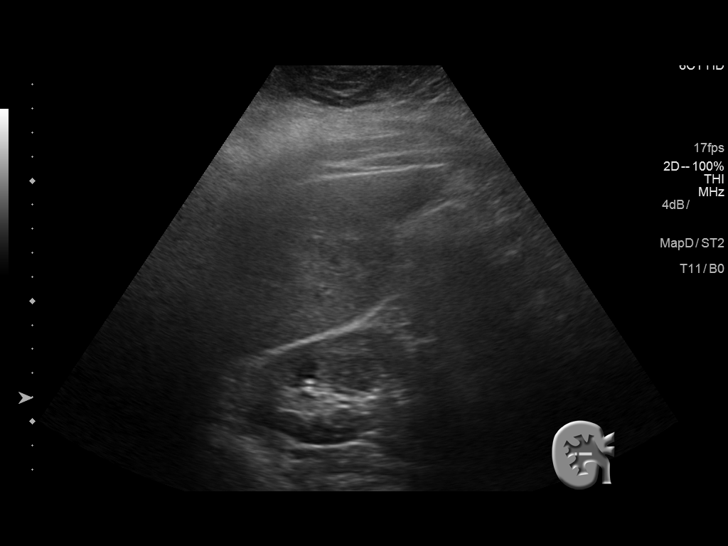
[im 61/73]
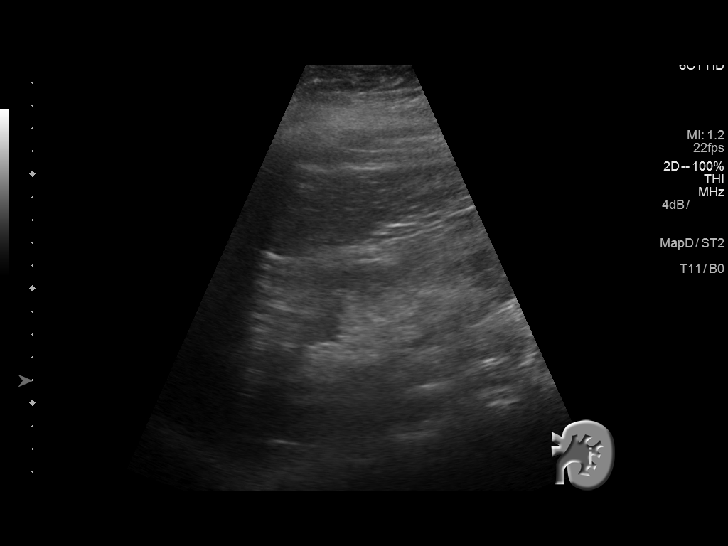
[im 67/73]
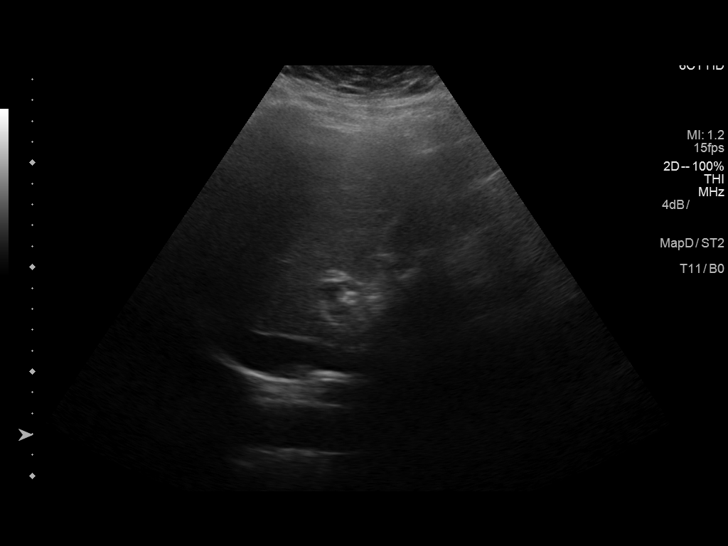
[im 73/73]
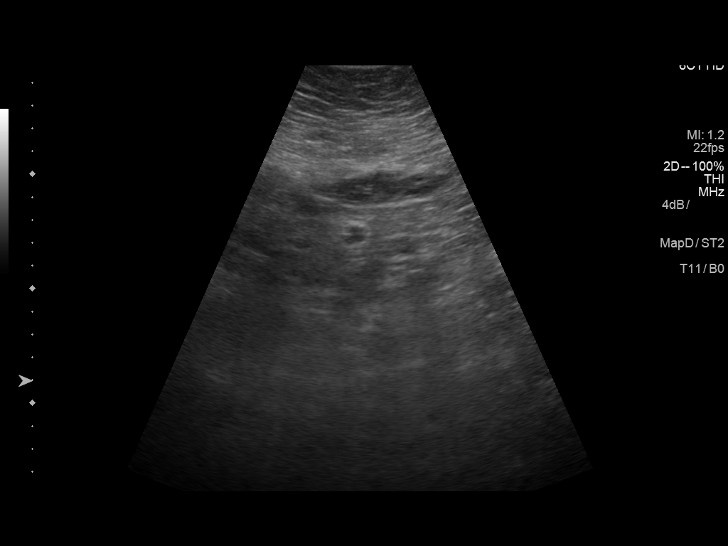

[13 of 25 positions shown; findings below may reference images not displayed]

FINDINGS: Gallbladder: Multiple gallstones are noted, the largest measuring
1.4 cm. Gallbladder wall is minimally thickened measuring 3 mm. No
sonographic Murphy sign or pericholecystic fluid.

Common bile duct: Diameter: 3 mm. No intrahepatic or extrahepatic
biliary dilatation.

Liver: No focal lesion identified. Slightly increased hepatic
echogenicity noted.. Portal vein is patent on color Doppler imaging
with normal direction of blood flow towards the liver.

IVC: No abnormality visualized.

Pancreas: Visualized portion unremarkable.

Spleen: Size and appearance within normal limits.

Right Kidney: Length: 11 cm. Echogenicity within normal limits. No
mass or hydronephrosis visualized.

Left Kidney: Length: 13.6 cm. Echogenicity within normal limits. No
mass or hydronephrosis visualized.

Abdominal aorta: No aneurysm visualized.

Other findings: None.
IMPRESSION: 1. Cholelithiasis with minimal gallbladder thickening, equivocal for
acute cholecystitis. If there is strong clinical suspicion for acute
cholecystitis, consider nuclear medicine study. No biliary
dilatation.
2. Question mild hepatic steatosis.

## 2019-07-09 ENCOUNTER — Institutional Professional Consult (permissible substitution): Payer: Medicaid Other | Admitting: Plastic Surgery

## 2020-03-26 ENCOUNTER — Emergency Department (HOSPITAL_COMMUNITY)
Admission: EM | Admit: 2020-03-26 | Discharge: 2020-03-26 | Disposition: A | Discharge status: Home / Self Care | Payer: Medicaid Other | Attending: Emergency Medicine | Admitting: Emergency Medicine

## 2020-03-26 ENCOUNTER — Other Ambulatory Visit: Payer: Self-pay

## 2020-03-26 ENCOUNTER — Encounter (HOSPITAL_COMMUNITY): Payer: Self-pay | Admitting: *Deleted

## 2020-03-26 DIAGNOSIS — H81399 Other peripheral vertigo, unspecified ear: Secondary | ICD-10-CM

## 2020-03-26 DIAGNOSIS — R1110 Vomiting, unspecified: Secondary | ICD-10-CM | POA: Insufficient documentation

## 2020-03-26 DIAGNOSIS — R42 Dizziness and giddiness: Secondary | ICD-10-CM | POA: Insufficient documentation

## 2020-03-26 MED ORDER — MECLIZINE HCL 25 MG PO TABS: 25.0000 mg | ORAL_TABLET | Freq: Three times a day (TID) | ORAL | 0 refills | Status: AC | PRN

## 2020-03-26 MED ORDER — MECLIZINE HCL 12.5 MG PO TABS
50.0000 mg | ORAL_TABLET | Freq: Once | ORAL | Status: AC
  Administered 2020-03-26: 50 mg via ORAL
  Filled 2020-03-26: qty 4

## 2020-03-26 NOTE — ED Triage Notes (Signed)
States she woke up this am with dizziness, nausea and vomiting

## 2020-03-26 NOTE — ED Notes (Addendum)
Entered room and introduced self to patient. Pt appears to be resting in bed, respirations are even and unlabored with equal chest rise and fall. Bed is locked in the lowest position, side rails x2, call bell within reach. Pt educated on call light use and hourly rounding, verbalized understanding and in agreement at this time. All questions and concerns voiced addressed. Refreshments offered and provided per patient request.  

## 2020-03-26 NOTE — ED Provider Notes (Signed)
[facility] EMERGENCY DEPARTMENT Provider Note   CSN: [id number] Arrival date & time: 03/26/20  1323     History Chief Complaint  Patient presents with  . Dizziness    Jacqueline Patterson is a 50 y.o. female  The history is provided by the patient. No language interpreter was used.  Dizziness Quality:  Head spinning, vertigo, imbalance, room spinning and lightheadedness Severity:  Moderate Onset quality:  Sudden Duration:  1 day Timing:  Intermittent Progression:  Improving Chronicity:  New Context: bending over, eye movement, head movement and standing up   Context: not with loss of consciousness   Relieved by:  Being still Worsened by:  Closing eyes, eye movement, movement, standing up, turning head and sitting upright Ineffective treatments:  None tried Associated symptoms: vomiting   Associated symptoms: no blood in stool, no chest pain, no diarrhea, no headaches, no hearing loss, no nausea, no palpitations, no shortness of breath, no syncope, no tinnitus, no vision changes and no weakness   Risk factors: no anemia, no heart disease, no hx of stroke, no hx of vertigo, no Meniere's disease, no multiple medications and no new medications        History reviewed. No pertinent past medical history.  Patient Active Problem List   Diagnosis Date Noted  . Calculus of gallbladder without cholecystitis without obstruction     Past Surgical History:  Procedure Laterality Date  . BACK SURGERY    . CESAREAN SECTION     x2  . CHOLECYSTECTOMY N/A 10/05/2017   Procedure: LAPAROSCOPIC CHOLECYSTECTOMY;  Surgeon: Franky Macho, MD;  Location: [facility];  Service: General;  Laterality: N/A;  . NECK SURGERY    . TUBAL LIGATION  2001   with c-section     OB History    Gravida  2   Para  2   Term  2   Preterm      AB      Living  2     SAB      IAB      Ectopic      Multiple      Live Births              Family History  Problem Relation Age of Onset  .  Cancer Mother   . Stroke Other   . Seizures Other     Social History   Tobacco Use  . Smoking status: Never Smoker  . Smokeless tobacco: Never Used  Substance Use Topics  . Alcohol use: No  . Drug use: No    Home Medications Prior to Admission medications   Medication Sig Start Date End Date Taking? Authorizing Provider  clindamycin (CLEOCIN) 150 MG capsule Take 2 capsules (300 mg total) by mouth 4 (four) times daily. May dispense as 150mg  capsules 08/01/18   10/02/18, PA-C  oxyCODONE-acetaminophen (PERCOCET) 5-325 MG tablet Take 1 tablet by mouth every 6 (six) hours as needed for severe pain. 08/01/18   10/02/18, PA-C    Allergies    Patient has no known allergies.  Review of Systems   Review of Systems  HENT: Negative for hearing loss and tinnitus.   Respiratory: Negative for shortness of breath.   Cardiovascular: Negative for chest pain, palpitations and syncope.  Gastrointestinal: Positive for vomiting. Negative for blood in stool, diarrhea and nausea.  Neurological: Positive for dizziness. Negative for weakness and headaches.    Physical Exam Updated Vital Signs BP 140/69 (BP Location: Right Arm)   Pulse  68   Temp 98 F (36.7 C) (Oral)   Resp 20   LMP 03/26/2020   SpO2 96%   Physical Exam Vitals and nursing note reviewed.  Constitutional:      General: She is not in acute distress.    Appearance: She is well-developed and well-nourished. She is not diaphoretic.  HENT:     Head: Normocephalic and atraumatic.  Eyes:     General: No scleral icterus.    Conjunctiva/sclera: Conjunctivae normal.     Comments: Nystagmus to the left  Cardiovascular:     Rate and Rhythm: Normal rate and regular rhythm.     Heart sounds: Normal heart sounds. No murmur heard. No friction rub. No gallop.   Pulmonary:     Effort: Pulmonary effort is normal. No respiratory distress.     Breath sounds: Normal breath sounds.  Abdominal:     General: Bowel sounds are  normal. There is no distension.     Palpations: Abdomen is soft. There is no mass.     Tenderness: There is no abdominal tenderness. There is no guarding.  Musculoskeletal:     Cervical back: Normal range of motion.  Skin:    General: Skin is warm and dry.  Neurological:     General: No focal deficit present.     Mental Status: She is alert and oriented to person, place, and time.     Cranial Nerves: No cranial nerve deficit.     Sensory: No sensory deficit.     Motor: No weakness.     Coordination: Coordination normal.     Deep Tendon Reflexes: Reflexes normal.     Comments: Normal F-N, H-S No pronator drift   Psychiatric:        Behavior: Behavior normal.     ED Results / Procedures / Treatments   Labs (all labs ordered are listed, but only abnormal results are displayed) Labs Reviewed - No data to display  EKG None  Radiology No results found.  Procedures Procedures   Medications Ordered in ED Medications  meclizine (ANTIVERT) tablet 50 mg (has no administration in time range)    ED Course  I have reviewed the triage vital signs and the nursing notes.  Pertinent labs & imaging results that were available during my care of the patient were reviewed by me and considered in my medical decision making (see chart for details).    MDM Rules/Calculators/A&P                         Patient here with vertigo sxs. The emergent differential diagnosis for acute vertigo low includes peripheral causes such as BPPV, barotrauma, ear foreign body, Mnire's disease, infectious causes such as lip bronchitis, vestibular neuritis or Ramsay Hunt syndrome.  Other emergent causes are central such as cerebellar stroke, vertebrobasilar insufficiency, neoplastic causes, vertebral artery dissection, MS, neurosyphilis or tuberculosis, epilepsy or migraine.  Other causes include anemia, hyperviscosity syndrome, alcohol or aminoglycoside use, renal failure, hypoglycemia and thyroid  disease. Patient's symptoms are extremely consistent with peripheral causes of vertigo most likely BPPV.  Patient given 50 mg of meclizine with significant improvement in her symptoms.  She is no longer ataxic and does not have severe nausea or spinning.  Patient will be discharged with treatment for peripheral vertigo.  Discussed outpatient follow-up.  Given handout for Epley's movers.  Appropriate for discharge   Final Clinical Impression(s) / ED Diagnoses Final diagnoses:  None    Rx /  DC Orders ED Discharge Orders    None       Arthor Captain, PA-C 03/26/20 2310    Maia Plan, MD 03/29/20 1024

## 2020-03-26 NOTE — ED Notes (Signed)
ED Provider at bedside. 

## 2020-03-26 NOTE — Discharge Instructions (Signed)
Contact a health care provider if: You have a fever. Your condition gets worse or you develop new symptoms. Your family or friends notice any behavioral changes. You have nausea or vomiting that gets worse. You have numbness or a prickling and tingling sensation. Get help right away if you: Have difficulty speaking or moving. Are always dizzy. Faint. Develop severe headaches. Have weakness in your legs or arms. Have changes in your hearing or vision. Develop a stiff neck. Develop sensitivity to light.
